# Patient Record
Sex: Male | Born: 1945 | Race: White | Hispanic: No | Marital: Married | Smoking: Former smoker
Health system: Southern US, Community
[De-identification: ages and names within clinical notes are randomized; demographics above are authoritative.]

## PROBLEM LIST (undated history)

## (undated) DIAGNOSIS — F329 Major depressive disorder, single episode, unspecified: Secondary | ICD-10-CM

## (undated) DIAGNOSIS — G35 Multiple sclerosis: Secondary | ICD-10-CM

## (undated) DIAGNOSIS — G2581 Restless legs syndrome: Secondary | ICD-10-CM

## (undated) DIAGNOSIS — F32A Depression, unspecified: Secondary | ICD-10-CM

## (undated) DIAGNOSIS — K219 Gastro-esophageal reflux disease without esophagitis: Secondary | ICD-10-CM

## (undated) DIAGNOSIS — F419 Anxiety disorder, unspecified: Secondary | ICD-10-CM

## (undated) DIAGNOSIS — Z87442 Personal history of urinary calculi: Secondary | ICD-10-CM

## (undated) DIAGNOSIS — J449 Chronic obstructive pulmonary disease, unspecified: Secondary | ICD-10-CM

## (undated) DIAGNOSIS — I10 Essential (primary) hypertension: Secondary | ICD-10-CM

## (undated) DIAGNOSIS — N200 Calculus of kidney: Secondary | ICD-10-CM

## (undated) HISTORY — DX: Calculus of kidney: N20.0

---
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
## 2010-04-05 ENCOUNTER — Ambulatory Visit: Payer: Self-pay | Admitting: Internal Medicine

## 2010-07-21 ENCOUNTER — Ambulatory Visit: Payer: Self-pay | Admitting: Internal Medicine

## 2010-10-11 ENCOUNTER — Ambulatory Visit: Payer: Self-pay | Admitting: Internal Medicine

## 2011-08-22 DIAGNOSIS — K219 Gastro-esophageal reflux disease without esophagitis: Secondary | ICD-10-CM | POA: Insufficient documentation

## 2011-12-28 ENCOUNTER — Ambulatory Visit: Payer: Self-pay | Admitting: Medical

## 2012-03-05 DIAGNOSIS — E041 Nontoxic single thyroid nodule: Secondary | ICD-10-CM | POA: Insufficient documentation

## 2012-06-23 ENCOUNTER — Ambulatory Visit: Payer: Self-pay | Admitting: Family Medicine

## 2014-04-16 DIAGNOSIS — R931 Abnormal findings on diagnostic imaging of heart and coronary circulation: Secondary | ICD-10-CM | POA: Insufficient documentation

## 2014-04-16 DIAGNOSIS — R9439 Abnormal result of other cardiovascular function study: Secondary | ICD-10-CM | POA: Insufficient documentation

## 2014-05-20 DIAGNOSIS — J479 Bronchiectasis, uncomplicated: Secondary | ICD-10-CM | POA: Insufficient documentation

## 2016-01-03 DIAGNOSIS — D649 Anemia, unspecified: Secondary | ICD-10-CM

## 2016-01-03 HISTORY — DX: Anemia, unspecified: D64.9

## 2016-01-12 DIAGNOSIS — N400 Enlarged prostate without lower urinary tract symptoms: Secondary | ICD-10-CM | POA: Insufficient documentation

## 2016-01-12 DIAGNOSIS — N529 Male erectile dysfunction, unspecified: Secondary | ICD-10-CM | POA: Insufficient documentation

## 2017-03-20 DIAGNOSIS — J189 Pneumonia, unspecified organism: Secondary | ICD-10-CM | POA: Insufficient documentation

## 2017-03-21 DIAGNOSIS — J441 Chronic obstructive pulmonary disease with (acute) exacerbation: Secondary | ICD-10-CM | POA: Insufficient documentation

## 2017-05-10 DIAGNOSIS — G2581 Restless legs syndrome: Secondary | ICD-10-CM | POA: Insufficient documentation

## 2017-07-10 DIAGNOSIS — S46012A Strain of muscle(s) and tendon(s) of the rotator cuff of left shoulder, initial encounter: Secondary | ICD-10-CM | POA: Insufficient documentation

## 2017-09-17 DIAGNOSIS — E876 Hypokalemia: Secondary | ICD-10-CM | POA: Insufficient documentation

## 2018-09-02 DIAGNOSIS — R072 Precordial pain: Secondary | ICD-10-CM | POA: Insufficient documentation

## 2018-11-07 ENCOUNTER — Ambulatory Visit
Admission: EM | Admit: 2018-11-07 | Discharge: 2018-11-07 | Disposition: A | Discharge status: Home / Self Care | Payer: Medicare Other | Attending: Family Medicine | Admitting: Family Medicine

## 2018-11-07 ENCOUNTER — Other Ambulatory Visit: Payer: Self-pay

## 2018-11-07 DIAGNOSIS — J3489 Other specified disorders of nose and nasal sinuses: Secondary | ICD-10-CM | POA: Diagnosis not present

## 2018-11-07 MED ORDER — SULFAMETHOXAZOLE-TRIMETHOPRIM 800-160 MG PO TABS: 1.0000 | ORAL_TABLET | Freq: Two times a day (BID) | ORAL | 0 refills | Status: AC

## 2018-11-07 MED ORDER — MUPIROCIN 2 % EX OINT: TOPICAL_OINTMENT | CUTANEOUS | 0 refills | Status: DC

## 2018-11-07 NOTE — Discharge Instructions (Signed)
Medications as prescribed.  Call with concerns.  Take care  Dr. Eulamae Greenstein  

## 2018-11-07 NOTE — ED Triage Notes (Signed)
Patient c/o a sore inside his nose x 3 days. He states the area is painful and swollen.

## 2018-11-07 NOTE — ED Provider Notes (Signed)
[facility]    CSN: [id number] Arrival date & time: 11/07/18  0831  History   Chief Complaint Chief Complaint  Patient presents with  . Facial Pain   HPI  73 year old male presents with complaints of a sore inside of his nose.  Patient reports a 3 to 4-day history of a sore inside of his nose.  Located on the left side.  He states that the area is painful and swollen.  Rates his pain as 4/10 in severity.  Unsure of an inciting event.  No fever.  No known exacerbating factors.  No known relieving factors.  No other associated symptoms.   PMH, Surgical Hx, Family Hx, Social History reviewed and updated as below.  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    Surgical Hx: LENS EYE SURGERY 07/13/11 Left CE/PCIOL TORIC lens ou   ESOPHAGOGASTRODOUDENOSCOPY W/BIOPSY 02/05/2013  Procedure: [id number] W/BIOPSY; Surgeon: Daralene Milch., MD; Location: [facility] ENDO/BRONCH; Service: Gastroenterology;;   CATARACT EXTRACTION 01/02/2009 - 01/01/2010     COLONOSCOPY W/BIOPSY 01/11/2017  Procedure: COLONOSCOPY, FLEXIBLE; WITH BIOPSY, SINGLE OR MULTIPLE; Surgeon: Reynolds Bowl, MD; Location: [facility] ENDO/BRONCH; Service: Gastroenterology;;     Home Medications    Prior to Admission medications   Medication Sig Start Date End Date Taking? Authorizing Provider  albuterol (PROVENTIL) (2.5 MG/3ML) 0.083% nebulizer solution INHALE 1 VIAL VIA NEBULIZER EVERY 6 HOURS AS NEEDED FOR WHEEZING 06/29/18  Yes [provider]  Albuterol Sulfate 2.5 MG/0.5ML NEBU Inhale  into the lungs. 03/21/17  Yes [provider]  ALPRAZolam Prudy Feeler) 0.25 MG tablet Take by mouth. 06/04/09  Yes [provider]  amLODipine (NORVASC) 5 MG tablet Take 5 mg by mouth daily. 05/22/18  Yes [provider]  aspirin 81 MG chewable tablet Chew by mouth. 09/04/18  Yes [provider]  atorvastatin (LIPITOR) 80 MG tablet Take by mouth. 09/04/18  Yes [provider]  buPROPion (WELLBUTRIN SR) 100 MG 12 hr tablet Take by mouth. 06/17/14  Yes [provider]  Cholecalciferol (D2000 ULTRA STRENGTH) 50 MCG (2000 UT) CAPS Take by mouth.   Yes [provider]  esomeprazole (NEXIUM) 40 MG capsule Take by mouth. 01/12/12  Yes [provider]  fluticasone (FLONASE) 50 MCG/ACT nasal spray Place into the nose.   Yes [provider]  Fluticasone-Umeclidin-Vilant 100-62.5-25 MCG/INH AEPB Inhale into the lungs. 09/26/16  Yes [provider]  gabapentin (NEURONTIN) 600 MG tablet Take by mouth. 06/04/09  Yes [provider]  glatiramer (COPAXONE) 20 MG/ML SOSY injection  08/08/06  Yes [provider]  losartan (COZAAR) 100 MG tablet Take by mouth. 11/04/18  Yes [provider]  meclizine (ANTIVERT) 25 MG tablet prn as needed 06/04/09  Yes [provider]  Multiple Vitamin (MULTI-VITAMIN) tablet Take by mouth.   Yes [provider]  potassium chloride SA (KLOR-CON) 20 MEQ tablet Three tabs orally twice daily 04/29/12  Yes [provider]  sertraline (ZOLOFT) 100 MG tablet Take by mouth. 06/04/09  Yes [provider]  spironolactone (ALDACTONE) 25 MG tablet Take by mouth. 08/14/18 08/14/19 Yes  [provider]  zolpidem (AMBIEN CR) 12.5 MG CR tablet    Yes [provider]  mupirocin ointment (BACTROBAN) 2 % Apply to nasal sore twice daily for 7 days. 11/07/18   Coral Spikes, DO  sulfamethoxazole-trimethoprim (BACTRIM DS) 800-160 MG tablet Take 1 tablet by mouth 2  (two) times daily for 7 days. 11/07/18 11/14/18  Coral Spikes, DO    Family History Diabetes type II Brother Arthur Deceased  Obesity Brother Arnell Sieving   Diabetes Father Josafat Enrico   Diabetes type II Father Anthonyjames Bargar Deceased  Obesity Father Courtenay Creger   No Known Problems Maternal Aunt    Asthma Maternal Grandfather Joe Tobey Grim Deceased  Cancer Maternal Grandfather Einar Crow   Heart disease Maternal Grandmother Grant Ruts   Osteoarthritis Maternal Grandmother Grant Ruts   No Known Problems Maternal Uncle    Allergies Mother Earnest Rosier   Deep vein thrombosis (DVT or abnormal blood clot formation) Mother Earnest Rosier Deceased  Hearing loss Mother Earnest Rosier   Heart disease Mother Earnest Rosier   High blood pressure (Hypertension) Mother Earnest Rosier   Neurological disorder Mother Earnest Rosier   Osteoporosis (Thinning of bones) Mother Earnest Rosier Deceased  Other Mother Earnest Rosier ALS  Stroke Mother Earnest Rosier   No Known Problems Paternal Aunt    Heart disease Paternal Grandfather Nicky Kras   Obesity Paternal Grandfather Rc Amison   Diabetes Paternal Grandmother Jeffey Janssen   Diabetes type II Paternal Grandmother Ayomide Purdy Deceased  Skin cancer Paternal Grandmother Aztlan Coll   Stroke Paternal Grandmother Rambo Sarafian   Colon cancer Paternal Uncle Kyandre Okray Deceased  Allergies Sister    Allergic rhinitis Sister Orvis Brill   Deep vein thrombosis (DVT or abnormal blood clot formation) Sister Orvis Brill   Depression Sister Orvis Brill   High blood pressure (Hypertension) Sister Orvis Brill   Skin cancer Sister Orvis Brill   Heart disease Son    Alcohol abuse Son Dwayne Deceased  Anesthesia problems Neg Hx    Clotting disorder Neg Hx    Thyroid disease Neg Hx      Social History Social History   Tobacco Use  . Smoking status: Not on file  Substance Use Topics  . Alcohol use: Not on file  . Drug use: Not  on file     Allergies   Latex, Penicillins, and Doxycycline   Review of Systems Review of Systems  Constitutional: Negative.   HENT:       Sore in nose.   Physical Exam Triage Vital Signs ED Triage Vitals  Enc Vitals Group     BP 11/07/18 0847 (!) 141/68     Pulse Rate 11/07/18 0847 75     Resp 11/07/18 0847 18     Temp 11/07/18 0847 98 F (36.7 C)     Temp Source 11/07/18 0847 Oral     SpO2 11/07/18 0847 98 %     Weight 11/07/18 0844 215 lb (97.5 kg)     Height 11/07/18 0844 6' (1.829 m)     Head Circumference --      Peak Flow --      Pain Score 11/07/18 0843 4     Pain Loc --      Pain Edu? --      Excl. in [place]? --    Updated Vital Signs BP (!) 141/68 (BP Location: Right Arm)   Pulse 75   Temp 98 F (36.7 C) (Oral)  Resp 18   Ht 6' (1.829 m)   Wt 97.5 kg   SpO2 98%   BMI 29.16 kg/m   Visual Acuity Right Eye Distance:   Left Eye Distance:   Bilateral Distance:    Right Eye Near:   Left Eye Near:    Bilateral Near:     Physical Exam Vitals signs and nursing note reviewed.  Constitutional:      General: He is not in acute distress.    Appearance: Normal appearance. He is not ill-appearing.  HENT:     Head: Normocephalic and atraumatic.     Nose:     Comments: Focal area of swelling and erythema located inside the left nostril.  Dried blood and also noted. Eyes:     General:        Right eye: No discharge.        Left eye: No discharge.     Conjunctiva/sclera: Conjunctivae normal.  Cardiovascular:     Rate and Rhythm: Normal rate and regular rhythm.     Heart sounds: No murmur.  Pulmonary:     Effort: Pulmonary effort is normal.     Breath sounds: Normal breath sounds. No wheezing, rhonchi or rales.  Neurological:     Mental Status: He is alert.  Psychiatric:        Mood and Affect: Mood normal.        Behavior: Behavior normal.    UC Treatments / Results  Labs (all labs ordered are listed, but only abnormal results are displayed)  Labs Reviewed - No data to display  EKG   Radiology No results found.  Procedures Procedures (including critical care time)  Medications Ordered in UC Medications - No data to display  Initial Impression / Assessment and Plan / UC Course  I have reviewed the triage vital signs and the nursing notes.  Pertinent labs & imaging results that were available during my care of the patient were reviewed by me and considered in my medical decision making (see chart for details).    73 year old male presents with a sore inside of his nose.  Concern for developing abscess.  Placing on Bactrim and Bactroban ointment.  Final Clinical Impressions(s) / UC Diagnoses   Final diagnoses:  Sore in nose     Discharge Instructions     Medications as prescribed.  Call with concerns.  Take care  Dr. Adriana Simasook     ED Prescriptions    Medication Sig Dispense Auth. Provider   sulfamethoxazole-trimethoprim (BACTRIM DS) 800-160 MG tablet Take 1 tablet by mouth 2 (two) times daily for 7 days. 14 tablet Kaeleen Odom G, DO   mupirocin ointment (BACTROBAN) 2 % Apply to nasal sore twice daily for 7 days. 22 g Tommie Samsook, Kanaan Kagawa G, DO     PDMP not reviewed this encounter.   Tommie SamsCook, Staphanie Harbison G, [place]DO 11/07/18 [phone]

## 2019-03-30 ENCOUNTER — Ambulatory Visit
Admission: EM | Admit: 2019-03-30 | Discharge: 2019-03-30 | Disposition: A | Discharge status: Home / Self Care | Payer: Medicare Other | Attending: Family Medicine | Admitting: Family Medicine

## 2019-03-30 ENCOUNTER — Ambulatory Visit: Payer: Medicare Other

## 2019-03-30 ENCOUNTER — Other Ambulatory Visit: Payer: Self-pay

## 2019-03-30 DIAGNOSIS — R079 Chest pain, unspecified: Secondary | ICD-10-CM | POA: Insufficient documentation

## 2019-03-30 DIAGNOSIS — S2231XA Fracture of one rib, right side, initial encounter for closed fracture: Secondary | ICD-10-CM | POA: Diagnosis not present

## 2019-03-30 DIAGNOSIS — G2581 Restless legs syndrome: Secondary | ICD-10-CM | POA: Insufficient documentation

## 2019-03-30 DIAGNOSIS — Y999 Unspecified external cause status: Secondary | ICD-10-CM | POA: Diagnosis not present

## 2019-03-30 DIAGNOSIS — J449 Chronic obstructive pulmonary disease, unspecified: Secondary | ICD-10-CM | POA: Diagnosis not present

## 2019-03-30 DIAGNOSIS — W19XXXA Unspecified fall, initial encounter: Secondary | ICD-10-CM | POA: Insufficient documentation

## 2019-03-30 DIAGNOSIS — Y929 Unspecified place or not applicable: Secondary | ICD-10-CM | POA: Insufficient documentation

## 2019-03-30 DIAGNOSIS — I10 Essential (primary) hypertension: Secondary | ICD-10-CM | POA: Insufficient documentation

## 2019-03-30 DIAGNOSIS — Y939 Activity, unspecified: Secondary | ICD-10-CM | POA: Diagnosis not present

## 2019-03-30 DIAGNOSIS — G35 Multiple sclerosis: Secondary | ICD-10-CM | POA: Insufficient documentation

## 2019-03-30 DIAGNOSIS — Z87891 Personal history of nicotine dependence: Secondary | ICD-10-CM | POA: Insufficient documentation

## 2019-03-30 HISTORY — DX: Multiple sclerosis: G35

## 2019-03-30 HISTORY — DX: Depression, unspecified: F32.A

## 2019-03-30 HISTORY — DX: Essential (primary) hypertension: I10

## 2019-03-30 HISTORY — DX: Gastro-esophageal reflux disease without esophagitis: K21.9

## 2019-03-30 HISTORY — DX: Restless legs syndrome: G25.81

## 2019-03-30 HISTORY — DX: Anxiety disorder, unspecified: F41.9

## 2019-03-30 HISTORY — DX: Chronic obstructive pulmonary disease, unspecified: J44.9

## 2019-03-30 MED ORDER — HYDROCODONE-ACETAMINOPHEN 5-325 MG PO TABS: 1.0000 | ORAL_TABLET | Freq: Three times a day (TID) | ORAL | 0 refills | Status: DC | PRN

## 2019-03-30 NOTE — Discharge Instructions (Addendum)
It was very nice seeing you today in clinic. Thank you for entrusting me with your care.   Make efforts to remain active. Take deep breaths often to keep from developing pneumonia. May use Tylenol and/or Ibuprofen as needed for pain. Will send in stronger pain medication for severe pain. BE CAREFUL with the pain medication as it can make you sleepy.   Make arrangements to follow up with your regular doctor in 1 week for re-evaluation if not improving. If your symptoms/condition worsens, please seek follow up care either here or in the ER. Please remember, our [facility] providers are "right here with you" when you need [place].   Again, it was my pleasure to take care of you today. Thank you for choosing our clinic. I hope that you start to feel better quickly.   Quentin Mulling, MSN, APRN, FNP-C, CEN Advanced Practice Provider [facility]

## 2019-03-30 NOTE — ED Provider Notes (Signed)
[place]    Name: Jordan [name] DOB: 1945-08-09 MRN: [id number] CSN: [id number] PCP: Jordan Heater, MD  Arrival date and time:  03/30/19 1045  Chief Complaint:  Fall  NOTE: Prior to seeing the patient today, I have reviewed the triage nursing documentation and vital signs. Clinical staff has updated patient's PMH/PSHx, current medication list, and drug allergies/intolerances to ensure comprehensive history available to assist in medical decision making.   History:   HPI: Jordan [name] is a 74 y.o. male who presents today with complaints of pain in his RIGHT lateral chest wall (ribs) following a traumatic fall on Friday (03/28/2019). Patient reports that he was backing down off of a ladder and stepped backwards causing him to fall/trip over a nearby plastic tote. Patient reports that fall caused him to fall into the wall onto his RIGHT shoulder and RIGHT side. Patient did not hit his head when he fell; no LOC. He denies complaints of neck pain. Patient has FROM in the shoulder and notes that it is not painful. He is not experiencing any distal weakness, paraesthesias, or altered sensation. Since his fall, patient reports that he has had pain in his lateral chest wall. He had difficulties sleeping last night due to the pain; patient sleeps on RIGHT side. He has been monitoring his SPO2 at home and reports that it has been normal. Pain increases with deep inspiration, bending, and torso twisting. He reports intermittent feelings of being short of breath; PMH (+) COPD. In efforts to conservatively manage his symptoms at home, the patient notes that he has used APAP, which has not helped to improve his symptoms. Patient presents today in NAD. SPO2 99% on RA. Pain rated 5/10 in clinic.   Past Medical History:  Diagnosis Date  . Anxiety   . COPD (chronic obstructive pulmonary disease) (HCC)   . Depression   . GERD (gastroesophageal reflux disease)   . Hypertension   . MS (multiple sclerosis)  (HCC)   . Restless leg     History reviewed. No pertinent surgical history.  History reviewed. No pertinent family history.  Social History   Tobacco Use  . Smoking status: Former Games developer  . Smokeless tobacco: Never Used  Substance Use Topics  . Alcohol use: Yes    Comment: beer with dinner  . Drug use: Never    There are no problems to display for this patient.   Home Medications:    No outpatient medications have been marked as taking for the 03/30/19 encounter [facility]).    Allergies:   Latex, Penicillins, and Doxycycline  Review of Systems (ROS):  Review of systems NEGATIVE unless otherwise noted in narrative H&P section.   Vital Signs: Today's Vitals   03/30/19 1055 03/30/19 1057 03/30/19 1159  BP: (!) 142/69    Pulse: 74    Resp: 18    Temp: 98.1 F (36.7 C)    TempSrc: Oral    SpO2: 99%    Weight:  215 lb (97.5 kg)   Height:  6' (1.829 m)   PainSc: 5   5     Physical Exam: Physical Exam  Constitutional: He is oriented to person, place, and time and well-developed, well-nourished, and in no distress.  HENT:  Head: Normocephalic and atraumatic.  Eyes: Pupils are equal, round, and reactive to light.  Cardiovascular: Normal rate, regular rhythm, normal heart sounds and intact distal pulses.  Pulmonary/Chest: Effort normal. He has decreased breath sounds in the right upper field and  the right middle field. He has no wheezes. He has no rhonchi. He has no rales. He exhibits tenderness (RIGHT (anterior) just inferior to breast). He exhibits no crepitus, no deformity, no swelling and no retraction.  No SOB or increased WOB. No distress. Able to speak in complete sentences without difficulties. SPO2 99% on RA.   Neurological: He is alert and oriented to person, place, and time. Gait normal.  Skin: Skin is warm and dry. No rash noted. He is not diaphoretic.  Psychiatric: Memory, affect and judgment normal. His mood appears anxious.  Nursing note and  vitals reviewed.   Urgent Care Treatments / Results:   Orders Placed This Encounter  Procedures  . DG Ribs Unilateral W/Chest Right  . Incentive spirometry RT    LABS: PLEASE NOTE: all labs that were ordered this encounter are listed, however only abnormal results are displayed. Labs Reviewed - No data to display  EKG: -None  RADIOLOGY: DG Ribs Unilateral W/Chest Right  Result Date: 03/30/2019 CLINICAL DATA:  Status post fall. Right rib pain. EXAM: RIGHT RIBS AND CHEST - 3+ VIEW COMPARISON:  None. FINDINGS: Nondisplaced fracture of the right anterior sixth rib adjacent to the costochondral junction. There is no evidence of pneumothorax or pleural effusion. Both lungs are clear. Heart size and mediastinal contours are within normal limits. IMPRESSION: Nondisplaced fracture of the right anterior sixth rib adjacent to the costochondral junction. Electronically Signed   By: Kathreen Devoid   On: 03/30/2019 11:33    PROCEDURES: Procedures  MEDICATIONS RECEIVED THIS VISIT: Medications - No data to display  PERTINENT CLINICAL COURSE NOTES/UPDATES:   Initial Impression / Assessment and Plan / Urgent Care Course:  Pertinent labs & imaging results that were available during my care of the patient were personally reviewed by me and considered in my medical decision making (see lab/imaging section of note for values and interpretations).  Jordan [name] is a 74 y.o. male who presents to [facility] today with complaints of Fall  Patient is well appearing overall in clinic today. He does not appear to be in any acute distress. Presenting symptoms (see HPI) and exam as documented above. Fall x 2 days ago. Complaints and exam concerning for rib fracture. Will obtain diagnostic plain films to further assess.   Diagnostic radiographs of the chest with dedicated RIGHT rib series an acute fracture of the RIGHT anterior 6th rib with no evidence of effusion or PTX.  Results reviewed with  patient. Discussed TCDB and pulmonary hygiene exercises in order to prevent the development of a pneumonia. Briefly reviewed the pathophysiology of hypoventilation as it relates to traumatic rib fractures, especially in the elderly. Patient encouraged to remain active as possible. Discussed splinting with a pillow for cough and position changes to help reduce pain. Encouraged ice and/or health application as tolerated. Patient may use APAP and/or IBU as needed for pain. Dicussed that this is a painful injury, and that I am concerned with the pain that he has been having, especially at night seeing whereas he generally sleeps on his RIGHT side. Will provide a short course of Norco 5/325 mg tablets for PRN use. Discussed indications and side effects; patient advised to exercise caution with this medication.   Discussed follow up with primary care physician in 1 week for re-evaluation. I have reviewed the follow up and strict return precautions for any new or worsening symptoms. Patient is aware of symptoms that would be deemed urgent/emergent, and would thus require further evaluation  either here or in the emergency department. At the time of discharge, he verbalized understanding and consent with the discharge plan as it was reviewed with him. All questions were fielded by provider and/or clinic staff prior to patient discharge.    Final Clinical Impressions / Urgent Care Diagnoses:   Final diagnoses:  Closed fracture of one rib of right side, initial encounter  Fall, accidental, initial encounter    New Prescriptions:  [place] Controlled Substance Registry consulted? Yes, I have consulted the [place] Controlled Substances Registry for this patient, and feel the risk/benefit ratio today is favorable for proceeding with this prescription for a controlled substance.  . Discussed use of controlled substance medication to treat his acute pain.  o Reviewed [place] STOP Act regulations  o Clinic does not refill controlled  substances over the phone without face to face evaluation.  . Safety precautions reviewed.  o Medications should not be bitten, chewed, sold, or taken with alcohol.  o Avoid use while working, driving, or operating heavy machinery.  o Side effects associated with the use of this particular medication reviewed. - Patient understands that this medication can cause CNS depression, increase his risk of falls, and even lead to overdose that may result in death, if used outside of the parameters that he and I discussed.  With all of this in mind, he knowingly accepts the risks and responsibilities associated with intended course of treatment, and elects to responsibly proceed as discussed.  Meds ordered this encounter  Medications  . HYDROcodone-acetaminophen (NORCO) 5-325 MG tablet    Sig: Take 1 tablet by mouth every 8 (eight) hours as needed for moderate pain.    Dispense:  12 tablet    Refill:  0    Recommended Follow up Care:  Patient encouraged to follow up with the following provider within the specified time frame, or sooner as dictated by the severity of his symptoms. As always, he was instructed that for any urgent/emergent care needs, he should seek care either here or in the emergency department for more immediate evaluation.  Follow-up Information    Jordan Heater, MD In 1 week.   Specialty: Family Medicine Why: General reassessment of symptoms if not improving Contact information: [address] [place] [address] [phone]         NOTE: This note was prepared using Dragon dictation software along with smaller phrase technology. Despite my best ability to proofread, there is the potential that transcriptional errors may still occur from this process, and are completely unintentional.    Verlee Monte, NP 03/31/19 1951

## 2019-03-30 NOTE — ED Triage Notes (Signed)
Pt reports he tripped backward over a tote in his garage on Friday. Fell against the wall with his right shoulder and pushed his weight into his right side. Having right rib pain and hard to take a deep breath.

## 2019-04-20 ENCOUNTER — Emergency Department
Admission: EM | Admit: 2019-04-20 | Discharge: 2019-04-20 | Disposition: A | Discharge status: Home / Self Care | Payer: Medicare Other | Attending: Emergency Medicine | Admitting: Emergency Medicine

## 2019-04-20 ENCOUNTER — Emergency Department: Payer: Medicare Other

## 2019-04-20 ENCOUNTER — Other Ambulatory Visit: Payer: Self-pay

## 2019-04-20 DIAGNOSIS — G35 Multiple sclerosis: Secondary | ICD-10-CM | POA: Diagnosis not present

## 2019-04-20 DIAGNOSIS — Z91040 Latex allergy status: Secondary | ICD-10-CM | POA: Diagnosis not present

## 2019-04-20 DIAGNOSIS — Z7982 Long term (current) use of aspirin: Secondary | ICD-10-CM | POA: Diagnosis not present

## 2019-04-20 DIAGNOSIS — J449 Chronic obstructive pulmonary disease, unspecified: Secondary | ICD-10-CM | POA: Diagnosis not present

## 2019-04-20 DIAGNOSIS — Z87891 Personal history of nicotine dependence: Secondary | ICD-10-CM | POA: Diagnosis not present

## 2019-04-20 DIAGNOSIS — R1032 Left lower quadrant pain: Secondary | ICD-10-CM | POA: Diagnosis present

## 2019-04-20 DIAGNOSIS — I10 Essential (primary) hypertension: Secondary | ICD-10-CM | POA: Diagnosis not present

## 2019-04-20 DIAGNOSIS — N200 Calculus of kidney: Secondary | ICD-10-CM | POA: Diagnosis not present

## 2019-04-20 DIAGNOSIS — Z79899 Other long term (current) drug therapy: Secondary | ICD-10-CM | POA: Diagnosis not present

## 2019-04-20 LAB — URINALYSIS, ROUTINE W REFLEX MICROSCOPIC
Bacteria, UA: NONE SEEN
Bilirubin Urine: NEGATIVE
Glucose, UA: NEGATIVE mg/dL
Ketones, ur: NEGATIVE mg/dL
Nitrite: NEGATIVE
Protein, ur: NEGATIVE mg/dL
Specific Gravity, Urine: 1.01 (ref 1.005–1.030)
pH: 7 (ref 5.0–8.0)

## 2019-04-20 LAB — CBC WITH DIFFERENTIAL/PLATELET
Abs Immature Granulocytes: 0.05 10*3/uL (ref 0.00–0.07)
Basophils Absolute: 0.1 10*3/uL (ref 0.0–0.1)
Basophils Relative: 1 %
Eosinophils Absolute: 0.4 10*3/uL (ref 0.0–0.5)
Eosinophils Relative: 3 %
HCT: 46 % (ref 39.0–52.0)
Hemoglobin: 15.7 g/dL (ref 13.0–17.0)
Immature Granulocytes: 0 %
Lymphocytes Relative: 10 %
Lymphs Abs: 1.2 10*3/uL (ref 0.7–4.0)
MCH: 31.2 pg (ref 26.0–34.0)
MCHC: 34.1 g/dL (ref 30.0–36.0)
MCV: 91.5 fL (ref 80.0–100.0)
Monocytes Absolute: 0.7 10*3/uL (ref 0.1–1.0)
Monocytes Relative: 6 %
Neutro Abs: 9.6 10*3/uL — ABNORMAL HIGH (ref 1.7–7.7)
Neutrophils Relative %: 80 %
Platelets: 192 10*3/uL (ref 150–400)
RBC: 5.03 MIL/uL (ref 4.22–5.81)
RDW: 13.3 % (ref 11.5–15.5)
WBC: 11.9 10*3/uL — ABNORMAL HIGH (ref 4.0–10.5)
nRBC: 0 % (ref 0.0–0.2)

## 2019-04-20 LAB — BASIC METABOLIC PANEL
Anion gap: 7 (ref 5–15)
BUN: 17 mg/dL (ref 8–23)
CO2: 28 mmol/L (ref 22–32)
Calcium: 9.2 mg/dL (ref 8.9–10.3)
Chloride: 102 mmol/L (ref 98–111)
Creatinine, Ser: 0.99 mg/dL (ref 0.61–1.24)
GFR calc Af Amer: >60 mL/min (ref >60)
GFR calc non Af Amer: >60 mL/min (ref >60)
Glucose, Bld: 127 mg/dL — ABNORMAL HIGH (ref 70–99)
Potassium: 4.6 mmol/L (ref 3.5–5.1)
Sodium: 137 mmol/L (ref 135–145)

## 2019-04-20 MED ORDER — ONDANSETRON HCL 4 MG PO TABS: 4.0000 mg | ORAL_TABLET | Freq: Three times a day (TID) | ORAL | 0 refills | Status: DC | PRN

## 2019-04-20 MED ORDER — HYDROCODONE-ACETAMINOPHEN 5-325 MG PO TABS: 1.0000 | ORAL_TABLET | Freq: Four times a day (QID) | ORAL | 0 refills | Status: AC | PRN

## 2019-04-20 MED ORDER — TAMSULOSIN HCL 0.4 MG PO CAPS: 0.4000 mg | ORAL_CAPSULE | Freq: Every day | ORAL | 0 refills | Status: DC

## 2019-04-20 MED ORDER — FENTANYL CITRATE (PF) 100 MCG/2ML IJ SOLN
50.0000 ug | Freq: Once | INTRAMUSCULAR | Status: AC
  Administered 2019-04-20: 50 ug via INTRAVENOUS
  Filled 2019-04-20: qty 2

## 2019-04-20 NOTE — ED Provider Notes (Signed)
[facility] Emergency Department Provider Note   ____________________________________________   I have reviewed the triage vital signs and the nursing notes.   HISTORY  Chief Complaint Groin Pain   History limited by: Not Limited   HPI Jordan [name] is a 74 y.o. male who presents to the emergency department today with concerns for left groin and left flank pain.  The patient states the pain started off in his groin early this morning.  He has not noticed any recent change or problems with urination.  Patient states that the pain has since started radiated up towards his left flank.  He does have a history of kidney stones and typically does get the pain in his left flank.  The patient states he is never required any lithotripsy for his previous kidney stones.  Last any stone was a number of years ago.  He denies any fevers.  Records reviewed. Per medical record review patient has a history of COPD, GERD, HTN.   Past Medical History:  Diagnosis Date  . Anxiety   . COPD (chronic obstructive pulmonary disease) ([place])   . Depression   . GERD (gastroesophageal reflux disease)   . Hypertension   . MS (multiple sclerosis) ([place])   . Restless leg     There are no problems to display for this patient.   No past surgical history on file.  Prior to Admission medications   Medication Sig Start Date End Date Taking? Authorizing Provider  albuterol (PROVENTIL) (2.5 MG/3ML) 0.083% nebulizer solution INHALE 1 VIAL VIA NEBULIZER EVERY 6 HOURS AS NEEDED FOR WHEEZING 06/29/18   [provider]  Albuterol Sulfate 2.5 MG/0.5ML NEBU Inhale into the lungs. 03/21/17   [provider]  ALPRAZolam Duanne Moron) 0.25 MG tablet Take by mouth. 06/04/09   [provider]  amLODipine (NORVASC) 5 MG tablet Take 5 mg by mouth daily. 05/22/18   [provider]  aspirin 81 MG chewable tablet Chew by mouth. 09/04/18   [provider]  atorvastatin  (LIPITOR) 80 MG tablet Take by mouth. 09/04/18   [provider]  buPROPion (WELLBUTRIN SR) 100 MG 12 hr tablet Take by mouth. 06/17/14   [provider]  Cholecalciferol (D2000 ULTRA STRENGTH) 50 MCG (2000 UT) CAPS Take by mouth.    [provider]  esomeprazole (NEXIUM) 40 MG capsule Take by mouth. 01/12/12   [provider]  fluticasone (FLONASE) 50 MCG/ACT nasal spray Place into the nose.    [provider]  Fluticasone-Umeclidin-Vilant 100-62.5-25 MCG/INH AEPB Inhale into the lungs. 09/26/16   [provider]  gabapentin (NEURONTIN) 600 MG tablet Take by mouth. 06/04/09   [provider]  glatiramer (COPAXONE) 20 MG/ML SOSY injection  08/08/06   [provider]  HYDROcodone-acetaminophen (NORCO) 5-325 MG tablet Take 1 tablet by mouth every 8 (eight) hours as needed for moderate pain. 03/30/19   Karen Kitchens, NP  losartan (COZAAR) 100 MG tablet Take by mouth. 11/04/18   [provider]  meclizine (ANTIVERT) 25 MG tablet prn as needed 06/04/09   [provider]  Multiple Vitamin (MULTI-VITAMIN) tablet Take by mouth.    [provider]  mupirocin ointment (BACTROBAN) 2 % Apply to nasal sore twice daily for 7 days. 11/07/18   Coral Spikes, DO  potassium chloride SA (KLOR-CON) 20 MEQ tablet Three tabs orally twice daily 04/29/12   [provider]  sertraline (ZOLOFT) 100 MG tablet Take by mouth. 06/04/09   [provider]  spironolactone (ALDACTONE) 25 MG tablet Take by mouth. 08/14/18 08/14/19  [provider]  zolpidem (AMBIEN CR) 12.5 MG CR tablet     [provider]    Allergies Latex, Penicillins, and Doxycycline  No family history on file.  Social History Social History   Tobacco Use  . Smoking status: Former Games developer  . Smokeless tobacco: Never Used  Substance Use Topics  . Alcohol use: Yes    Comment: beer with dinner  . Drug use: Never    Review of  Systems Constitutional: No fever/chills Eyes: No visual changes. ENT: No sore throat. Cardiovascular: Denies chest pain. Respiratory: Denies shortness of breath. Gastrointestinal: Positive for left flank pain. Genitourinary: Negative for dysuria. Positive for left groin pain.  Musculoskeletal: Negative for back pain. Skin: Negative for rash. Neurological: Negative for headaches, focal weakness or numbness.  ____________________________________________   PHYSICAL EXAM:  VITAL SIGNS: ED Triage Vitals  Enc Vitals Group     BP 04/20/19 0622 (!) 148/73     Pulse Rate 04/20/19 0622 75     Resp 04/20/19 0622 16     Temp 04/20/19 0622 98.4 F (36.9 C)     Temp Source 04/20/19 0622 Oral     SpO2 04/20/19 0622 97 %     Weight 04/20/19 0623 215 lb (97.5 kg)     Height 04/20/19 0623 6' (1.829 m)     Head Circumference --      Peak Flow --      Pain Score 04/20/19 0625 8   Constitutional: Alert and oriented.  Eyes: Conjunctivae are normal.  ENT      Head: Normocephalic and atraumatic.      Nose: No congestion/rhinnorhea.      Mouth/Throat: Mucous membranes are moist.      Neck: No stridor. Hematological/Lymphatic/Immunilogical: No cervical lymphadenopathy. Cardiovascular: Normal rate, regular rhythm.  No murmurs, rubs, or gallops.  Respiratory: Normal respiratory effort without tachypnea nor retractions. Breath sounds are clear and equal bilaterally. No wheezes/rales/rhonchi. Gastrointestinal: Soft and non tender. No rebound. No guarding.  Genitourinary: Deferred Musculoskeletal: Normal range of motion in all extremities. No lower extremity edema. Neurologic:  Normal speech and language. No gross focal neurologic deficits are appreciated.  Skin:  Skin is warm, dry and intact. No rash noted. Psychiatric: Mood and affect are normal. Speech and behavior are normal. Patient exhibits appropriate insight and judgment.  ____________________________________________    LABS (pertinent  positives/negatives)  BMP wnl except glu 127 CBC wbc 11.9, hgb 15.7, plt 192 UA hazy, small hgb dipstick, trace leukocytes, 21-50 rbc, 6-10 wbc ____________________________________________   EKG  None  ____________________________________________    RADIOLOGY  CT renal 2 calculi in left proximal ureter, largest measuring 1 cm.   ____________________________________________   PROCEDURES  Procedures  ____________________________________________   INITIAL IMPRESSION / ASSESSMENT AND PLAN / ED COURSE  Pertinent labs & imaging results that were available during my care of the patient were reviewed by me and considered in my medical decision making (see chart for details).   Patient presented to the emergency department today because of concerns for left groin and left flank pain.  Patient has history of kidney stones.  CT renal shows 2 proximal left ureteral stones.  Largest 1 cm in diameter.  Patient was given pain medication here which helped ease his pain.  At this time I doubt infection given lack of fever, very minimal leukocytosis in the serum and no bacteria seen in the UA.  Discussed with  Dr. Alycia Rossetti with urology will help arrange follow-up in clinic.  Discussed with patient importance of follow up with urology.  ____________________________________________   FINAL CLINICAL IMPRESSION(S) / ED DIAGNOSES  Final diagnoses:  Kidney stone     Note: This dictation was prepared with Dragon dictation. Any transcriptional errors that result from this process are unintentional     Phineas Semen, MD 04/20/19 1041

## 2019-04-20 NOTE — ED Notes (Signed)
Pt c/o flank pain and pain similar to previous kidney stones. Discussed with dr Derrill Kay, will order ct scan.

## 2019-04-20 NOTE — Discharge Instructions (Addendum)
Please seek medical attention for any high fevers, chest pain, shortness of breath, change in behavior, persistent vomiting, bloody stool or any other new or concerning symptoms.  

## 2019-04-20 NOTE — ED Notes (Signed)
Dr. Derrill Kay at bedside for for updated plan of care.  Pt verbalized understanding.  No acute changes or s/s of disterss noted at this time.  Wife at bedside.  Will continue to monitor/reassess.

## 2019-04-20 NOTE — ED Triage Notes (Signed)
Patient to lobby in wheelchair by EMS from home.  Patient complains of left groin pain that woke him from sleep.  Patient reports history of kidney stones in the past.

## 2019-04-21 ENCOUNTER — Other Ambulatory Visit: Payer: Self-pay | Admitting: *Deleted

## 2019-04-21 DIAGNOSIS — N200 Calculus of kidney: Secondary | ICD-10-CM

## 2019-04-21 NOTE — Progress Notes (Signed)
kub

## 2019-04-22 ENCOUNTER — Other Ambulatory Visit: Payer: Self-pay

## 2019-04-22 ENCOUNTER — Ambulatory Visit
Admission: RE | Admit: 2019-04-22 | Discharge: 2019-04-22 | Disposition: A | Discharge status: Home / Self Care | Payer: Medicare Other | Attending: Urology | Admitting: Urology

## 2019-04-22 ENCOUNTER — Encounter: Payer: Self-pay | Admitting: Urology

## 2019-04-22 ENCOUNTER — Ambulatory Visit (INDEPENDENT_AMBULATORY_CARE_PROVIDER_SITE_OTHER): Payer: Medicare Other | Admitting: Urology

## 2019-04-22 ENCOUNTER — Ambulatory Visit
Admission: RE | Admit: 2019-04-22 | Discharge: 2019-04-22 | Disposition: A | Discharge status: Home / Self Care | Payer: Medicare Other | Source: Ambulatory Visit | Attending: Urology | Admitting: Urology

## 2019-04-22 VITALS — BP 132/76 | HR 81 | Ht 72.0 in | Wt 217.0 lb

## 2019-04-22 DIAGNOSIS — N200 Calculus of kidney: Secondary | ICD-10-CM | POA: Diagnosis present

## 2019-04-22 DIAGNOSIS — N201 Calculus of ureter: Secondary | ICD-10-CM | POA: Diagnosis not present

## 2019-04-22 DIAGNOSIS — N132 Hydronephrosis with renal and ureteral calculous obstruction: Secondary | ICD-10-CM | POA: Diagnosis not present

## 2019-04-22 DIAGNOSIS — N23 Unspecified renal colic: Secondary | ICD-10-CM

## 2019-04-23 ENCOUNTER — Telehealth: Payer: Self-pay | Admitting: *Deleted

## 2019-04-23 ENCOUNTER — Encounter: Payer: Self-pay | Admitting: Urology

## 2019-04-23 NOTE — Telephone Encounter (Signed)
Will need orders please.

## 2019-04-23 NOTE — H&P (View-Only) (Signed)
 04/22/2019 7:44 AM   Jordan [name] 04/22/1945 [id number]  Referring provider: Adams, Randy Steven, MD [address] [place] [address]  Chief Complaint  Patient presents with  . Nephrolithiasis    HPI: Jordan [name] is a 73 y.o. male who presents for evaluation of a left ureteral calculus.  -Presented to [facility] ED 04/20/2019 with a several hour history of left groin pain radiating to the left hemiscrotum -No identifiable precipitating, aggravating or alleviating factors -Also some radiation to the left flank -Denied fever, chills, nausea, vomiting -Prior history stones not requiring intervention -Stone protocol CT showed a >10 mm left lower proximal ureteral calculus with mild to moderate hydronephrosis/hydroureter -Minimal pain since ED visit; has taken 1 hydrocodone   PMH: Past Medical History:  Diagnosis Date  . Anxiety   . COPD (chronic obstructive pulmonary disease) ([facility])   . Depression   . GERD (gastroesophageal reflux disease)   . Hypertension   . MS (multiple sclerosis) ([facility])   . Restless leg     Surgical History: No past surgical history on file.  Home Medications:  Allergies as of 04/22/2019      Reactions   Latex Anaphylaxis   Penicillins Hives, Rash   Doxycycline Other (See Comments)   Abdominal cramping      Medication List       Accurate as of April 22, 2019 11:59 PM. If you have any questions, ask your nurse or doctor.        Albuterol Sulfate 2.5 MG/0.5ML Nebu Inhale into the lungs.   albuterol (2.5 MG/3ML) 0.083% nebulizer solution Commonly known as: PROVENTIL INHALE 1 VIAL VIA NEBULIZER EVERY 6 HOURS AS NEEDED FOR WHEEZING   ALPRAZolam 0.25 MG tablet Commonly known as: XANAX Take by mouth.   amLODipine 5 MG tablet Commonly known as: NORVASC Take 5 mg by mouth daily.   aspirin 81 MG chewable tablet Chew by mouth.   atorvastatin 80 MG tablet Commonly known as: LIPITOR Take by mouth.   buPROPion 100 MG 12 hr  tablet Commonly known as: WELLBUTRIN SR Take by mouth.   D2000 Ultra Strength 50 MCG (2000 UT) Caps Generic drug: Cholecalciferol Take by mouth.   esomeprazole 40 MG capsule Commonly known as: NEXIUM Take by mouth.   fluticasone 50 MCG/ACT nasal spray Commonly known as: FLONASE Place into the nose.   Fluticasone-Umeclidin-Vilant 100-62.5-25 MCG/INH Aepb Inhale into the lungs.   gabapentin 600 MG tablet Commonly known as: NEURONTIN Take by mouth.   glatiramer 20 MG/ML Sosy injection Commonly known as: COPAXONE   HYDROcodone-acetaminophen 5-325 MG tablet Commonly known as: Norco Take 1 tablet by mouth every 8 (eight) hours as needed for moderate pain.   HYDROcodone-acetaminophen 5-325 MG tablet Commonly known as: NORCO/VICODIN Take 1 tablet by mouth every 6 (six) hours as needed for severe pain.   losartan 100 MG tablet Commonly known as: COZAAR Take by mouth.   meclizine 25 MG tablet Commonly known as: ANTIVERT prn as needed   Multi-Vitamin tablet Take by mouth.   mupirocin ointment 2 % Commonly known as: Bactroban Apply to nasal sore twice daily for 7 days.   ondansetron 4 MG tablet Commonly known as: Zofran Take 1 tablet (4 mg total) by mouth every 8 (eight) hours as needed.   potassium chloride SA 20 MEQ tablet Commonly known as: KLOR-CON Three tabs orally twice daily   sertraline 100 MG tablet Commonly known as: ZOLOFT Take by mouth.   spironolactone 25 MG tablet   Commonly known as: ALDACTONE Take by mouth.   tamsulosin 0.4 MG Caps capsule Commonly known as: Flomax Take 1 capsule (0.4 mg total) by mouth daily.   zolpidem 12.5 MG CR tablet Commonly known as: AMBIEN CR       Allergies:  Allergies  Allergen Reactions  . Latex Anaphylaxis  . Penicillins Hives and Rash  . Doxycycline Other (See Comments)    Abdominal cramping     Family History: No family history on file.  Social History:  reports that he has quit smoking. He has  never used smokeless tobacco. He reports current alcohol use. He reports that he does not use drugs.   Physical Exam: BP 132/76   Pulse 81   Ht 6' (1.829 m)   Wt 217 lb (98.4 kg)   BMI 29.43 kg/m   Constitutional:  Alert and oriented, No acute distress. HEENT: [place] AT, moist mucus membranes.  Trachea midline, no masses. Cardiovascular: No clubbing, cyanosis, or edema.  RRR Respiratory: Normal respiratory effort, no increased work of breathing.  Clear GI: Abdomen is soft, nontender, nondistended, no abdominal masses GU: No CVA tenderness Skin: No rashes, bruises or suspicious lesions. Neurologic: Grossly intact, no focal deficits, moving all 4 extremities. Psychiatric: Normal mood and affect.    Pertinent Imaging: CT personally reviewed.  This appears to be one crescent shaped calculus and not to calculi. Results for orders placed during the hospital encounter of 04/20/19  CT Renal Stone Study   Narrative CLINICAL DATA:  Acute left groin pain.  EXAM: CT ABDOMEN AND PELVIS WITHOUT CONTRAST  TECHNIQUE: Multidetector CT imaging of the abdomen and pelvis was performed following the standard protocol without IV contrast.  COMPARISON:  None.  FINDINGS: Lower chest: No acute abnormality.  Hepatobiliary: No focal liver abnormality is seen. No gallstones, gallbladder wall thickening, or biliary dilatation.  Pancreas: Unremarkable. No pancreatic ductal dilatation or surrounding inflammatory changes.  Spleen: Calcified splenic granulomata are noted.  Adrenals/Urinary Tract: Adrenal glands appear normal. Nonobstructive right nephrolithiasis is noted. Exophytic right renal cyst is noted. Multiple left renal cysts are noted. Mild left hydronephrosis is noted secondary to 2 adjacent calculi in the proximal right ureter, the largest measuring 1 cm. Perinephric stranding is noted on the left. Urinary bladder is unremarkable.  Stomach/Bowel: Stomach is within normal limits. Appendix  appears normal. No evidence of bowel wall thickening, distention, or inflammatory changes.  Vascular/Lymphatic: Aortic atherosclerosis. No enlarged abdominal or pelvic lymph nodes.  Reproductive: Mild prostatic enlargement is noted.  Other: No abdominal wall hernia or abnormality. No abdominopelvic ascites.  Musculoskeletal: No acute or significant osseous findings.  IMPRESSION: 1. Nonobstructive right nephrolithiasis. 2. Mild left hydronephrosis with perinephric stranding is noted secondary to 2 adjacent calculi in the proximal right ureter, the largest measuring 1 cm. 3. Mild prostatic enlargement.  Aortic Atherosclerosis (ICD10-I70.0).   Electronically Signed   By: Lupita Raider M.D.   On: 04/20/2019 08:51     Assessment & Plan:    - Left ureteral calculus Minimally symptomatic though based on stone size unlikely that it will pass spontaneously.  We discussed various treatment options for urolithiasis including observation with or without medical expulsive therapy, shockwave lithotripsy (SWL), ureteroscopy and laser lithotripsy with stent placement.  We discussed that management is based on stone size, location, density, patient co-morbidities, and patient preference.   Stones <67mm in size have a >80% spontaneous passage rate. Data surrounding the use of tamsulosin for medical expulsive therapy is controversial, but meta analyses suggests  it is most efficacious for distal stones between 5-61mm in size. Possible side effects include dizziness/lightheadedness, and retrograde ejaculation.  SWL has a lower stone free rate in a single procedure, but also a lower complication rate compared to ureteroscopy and avoids a stent and associated stent related symptoms. Possible complications include renal hematoma, steinstrasse, and need for additional treatment.  Ureteroscopy with laser lithotripsy and stent placement has a higher stone free rate than SWL in a single procedure,  however increased complication rate including possible infection, ureteral injury, bleeding, and stent related morbidity. Common stent related symptoms include dysuria, urgency/frequency, and flank pain.  The calculus is not well visualized on CT topogram and a KUB was ordered.  He would like to think over these options.  He is scheduled to head to the beach this coming Sunday and is contemplating delaying treatment briefly since he is minimally symptomatic   Abbie Sons, MD  [place] [address], [place], [place] [address] [phone]

## 2019-04-23 NOTE — Telephone Encounter (Signed)
Patient notified and is agreeable to go ahead with URS. He was notified that we would be in contact to schedule surgery

## 2019-04-23 NOTE — Progress Notes (Signed)
04/22/2019 7:44 AM   Jordan [name] 03-May-1945 [id number]  Referring provider: Garry Heater, MD [address] [place],  [place] [address]  Chief Complaint  Patient presents with  . Nephrolithiasis    HPI: Jordan [name] is a 74 y.o. male who presents for evaluation of a left ureteral calculus.  -Presented to [facility] ED 04/20/2019 with a several hour history of left groin pain radiating to the left hemiscrotum -No identifiable precipitating, aggravating or alleviating factors -Also some radiation to the left flank -Denied fever, chills, nausea, vomiting -Prior history stones not requiring intervention -Stone protocol CT showed a >10 mm left lower proximal ureteral calculus with mild to moderate hydronephrosis/hydroureter -Minimal pain since ED visit; has taken 1 hydrocodone   PMH: Past Medical History:  Diagnosis Date  . Anxiety   . COPD (chronic obstructive pulmonary disease) (HCC)   . Depression   . GERD (gastroesophageal reflux disease)   . Hypertension   . MS (multiple sclerosis) (HCC)   . Restless leg     Surgical History: No past surgical history on file.  Home Medications:  Allergies as of 04/22/2019      Reactions   Latex Anaphylaxis   Penicillins Hives, Rash   Doxycycline Other (See Comments)   Abdominal cramping      Medication List       Accurate as of April 22, 2019 11:59 PM. If you have any questions, ask your nurse or doctor.        Albuterol Sulfate 2.5 MG/0.5ML Nebu Inhale into the lungs.   albuterol (2.5 MG/3ML) 0.083% nebulizer solution Commonly known as: PROVENTIL INHALE 1 VIAL VIA NEBULIZER EVERY 6 HOURS AS NEEDED FOR WHEEZING   ALPRAZolam 0.25 MG tablet Commonly known as: XANAX Take by mouth.   amLODipine 5 MG tablet Commonly known as: NORVASC Take 5 mg by mouth daily.   aspirin 81 MG chewable tablet Chew by mouth.   atorvastatin 80 MG tablet Commonly known as: LIPITOR Take by mouth.   buPROPion 100 MG 12 hr  tablet Commonly known as: WELLBUTRIN SR Take by mouth.   D2000 Ultra Strength 50 MCG (2000 UT) Caps Generic drug: Cholecalciferol Take by mouth.   esomeprazole 40 MG capsule Commonly known as: NEXIUM Take by mouth.   fluticasone 50 MCG/ACT nasal spray Commonly known as: FLONASE Place into the nose.   Fluticasone-Umeclidin-Vilant 100-62.5-25 MCG/INH Aepb Inhale into the lungs.   gabapentin 600 MG tablet Commonly known as: NEURONTIN Take by mouth.   glatiramer 20 MG/ML Sosy injection Commonly known as: COPAXONE   HYDROcodone-acetaminophen 5-325 MG tablet Commonly known as: Norco Take 1 tablet by mouth every 8 (eight) hours as needed for moderate pain.   HYDROcodone-acetaminophen 5-325 MG tablet Commonly known as: NORCO/VICODIN Take 1 tablet by mouth every 6 (six) hours as needed for severe pain.   losartan 100 MG tablet Commonly known as: COZAAR Take by mouth.   meclizine 25 MG tablet Commonly known as: ANTIVERT prn as needed   Multi-Vitamin tablet Take by mouth.   mupirocin ointment 2 % Commonly known as: Bactroban Apply to nasal sore twice daily for 7 days.   ondansetron 4 MG tablet Commonly known as: Zofran Take 1 tablet (4 mg total) by mouth every 8 (eight) hours as needed.   potassium chloride SA 20 MEQ tablet Commonly known as: KLOR-CON Three tabs orally twice daily   sertraline 100 MG tablet Commonly known as: ZOLOFT Take by mouth.   spironolactone 25 MG tablet  Commonly known as: ALDACTONE Take by mouth.   tamsulosin 0.4 MG Caps capsule Commonly known as: Flomax Take 1 capsule (0.4 mg total) by mouth daily.   zolpidem 12.5 MG CR tablet Commonly known as: AMBIEN CR       Allergies:  Allergies  Allergen Reactions  . Latex Anaphylaxis  . Penicillins Hives and Rash  . Doxycycline Other (See Comments)    Abdominal cramping     Family History: No family history on file.  Social History:  reports that he has quit smoking. He has  never used smokeless tobacco. He reports current alcohol use. He reports that he does not use drugs.   Physical Exam: BP 132/76   Pulse 81   Ht 6' (1.829 m)   Wt 217 lb (98.4 kg)   BMI 29.43 kg/m   Constitutional:  Alert and oriented, No acute distress. HEENT: [place] AT, moist mucus membranes.  Trachea midline, no masses. Cardiovascular: No clubbing, cyanosis, or edema.  RRR Respiratory: Normal respiratory effort, no increased work of breathing.  Clear GI: Abdomen is soft, nontender, nondistended, no abdominal masses GU: No CVA tenderness Skin: No rashes, bruises or suspicious lesions. Neurologic: Grossly intact, no focal deficits, moving all 4 extremities. Psychiatric: Normal mood and affect.    Pertinent Imaging: CT personally reviewed.  This appears to be one crescent shaped calculus and not to calculi. Results for orders placed during the hospital encounter of 04/20/19  CT Renal Stone Study   Narrative CLINICAL DATA:  Acute left groin pain.  EXAM: CT ABDOMEN AND PELVIS WITHOUT CONTRAST  TECHNIQUE: Multidetector CT imaging of the abdomen and pelvis was performed following the standard protocol without IV contrast.  COMPARISON:  None.  FINDINGS: Lower chest: No acute abnormality.  Hepatobiliary: No focal liver abnormality is seen. No gallstones, gallbladder wall thickening, or biliary dilatation.  Pancreas: Unremarkable. No pancreatic ductal dilatation or surrounding inflammatory changes.  Spleen: Calcified splenic granulomata are noted.  Adrenals/Urinary Tract: Adrenal glands appear normal. Nonobstructive right nephrolithiasis is noted. Exophytic right renal cyst is noted. Multiple left renal cysts are noted. Mild left hydronephrosis is noted secondary to 2 adjacent calculi in the proximal right ureter, the largest measuring 1 cm. Perinephric stranding is noted on the left. Urinary bladder is unremarkable.  Stomach/Bowel: Stomach is within normal limits. Appendix  appears normal. No evidence of bowel wall thickening, distention, or inflammatory changes.  Vascular/Lymphatic: Aortic atherosclerosis. No enlarged abdominal or pelvic lymph nodes.  Reproductive: Mild prostatic enlargement is noted.  Other: No abdominal wall hernia or abnormality. No abdominopelvic ascites.  Musculoskeletal: No acute or significant osseous findings.  IMPRESSION: 1. Nonobstructive right nephrolithiasis. 2. Mild left hydronephrosis with perinephric stranding is noted secondary to 2 adjacent calculi in the proximal right ureter, the largest measuring 1 cm. 3. Mild prostatic enlargement.  Aortic Atherosclerosis (ICD10-I70.0).   Electronically Signed   By: Lupita Raider M.D.   On: 04/20/2019 08:51     Assessment & Plan:    - Left ureteral calculus Minimally symptomatic though based on stone size unlikely that it will pass spontaneously.  We discussed various treatment options for urolithiasis including observation with or without medical expulsive therapy, shockwave lithotripsy (SWL), ureteroscopy and laser lithotripsy with stent placement.  We discussed that management is based on stone size, location, density, patient co-morbidities, and patient preference.   Stones <67mm in size have a >80% spontaneous passage rate. Data surrounding the use of tamsulosin for medical expulsive therapy is controversial, but meta analyses suggests  it is most efficacious for distal stones between 5-61mm in size. Possible side effects include dizziness/lightheadedness, and retrograde ejaculation.  SWL has a lower stone free rate in a single procedure, but also a lower complication rate compared to ureteroscopy and avoids a stent and associated stent related symptoms. Possible complications include renal hematoma, steinstrasse, and need for additional treatment.  Ureteroscopy with laser lithotripsy and stent placement has a higher stone free rate than SWL in a single procedure,  however increased complication rate including possible infection, ureteral injury, bleeding, and stent related morbidity. Common stent related symptoms include dysuria, urgency/frequency, and flank pain.  The calculus is not well visualized on CT topogram and a KUB was ordered.  He would like to think over these options.  He is scheduled to head to the beach this coming Sunday and is contemplating delaying treatment briefly since he is minimally symptomatic   Abbie Sons, MD  [place] [address], [place], [place] [address] [phone]

## 2019-04-23 NOTE — Telephone Encounter (Signed)
-----   Message from Riki Altes, MD sent at 04/22/2019  3:51 PM EDT ----- It looks like his stone has progressed approximately 4 inches however based on location I think shockwave lithotripsy would be difficult and ureteroscopy would be the best option for treatment.

## 2019-04-24 ENCOUNTER — Other Ambulatory Visit: Payer: Self-pay

## 2019-04-24 ENCOUNTER — Other Ambulatory Visit: Payer: Self-pay | Admitting: Radiology

## 2019-04-24 DIAGNOSIS — N201 Calculus of ureter: Secondary | ICD-10-CM

## 2019-04-25 ENCOUNTER — Other Ambulatory Visit: Payer: Self-pay

## 2019-04-25 ENCOUNTER — Ambulatory Visit (INDEPENDENT_AMBULATORY_CARE_PROVIDER_SITE_OTHER): Payer: Medicare Other | Admitting: *Deleted

## 2019-04-25 DIAGNOSIS — N201 Calculus of ureter: Secondary | ICD-10-CM | POA: Diagnosis not present

## 2019-04-25 NOTE — Progress Notes (Signed)
In and Out Catheterization  Patient is present today for a I & O catheterization due to UA. Patient was cleaned and prepped in a sterile fashion with betadine . A 14FR cath was inserted no complications were noted , 73ml of urine return was noted, urine was yellow in color. A clean urine sample was collected for a UA/Urine culture. Bladder was drained  And catheter was removed with out difficulty.    Performed by: Milas Kocher, CMA  Follow up/ Additional notes: as scheduled

## 2019-04-28 LAB — CULTURE, URINE COMPREHENSIVE

## 2019-04-30 ENCOUNTER — Other Ambulatory Visit: Payer: Self-pay

## 2019-04-30 ENCOUNTER — Encounter
Admission: RE | Admit: 2019-04-30 | Discharge: 2019-04-30 | Disposition: A | Discharge status: Home / Self Care | Payer: Medicare Other | Source: Ambulatory Visit | Attending: Urology | Admitting: Urology

## 2019-04-30 HISTORY — DX: Personal history of urinary calculi: Z87.442

## 2019-04-30 NOTE — Patient Instructions (Signed)
Your procedure is scheduled on: Tuesday May 06, 2019 Report to Day Surgery inside [facility]. To find out your arrival time please call [phone] between 1PM - 3PM on Monday May 05, 2019.  Remember: Instructions that are not followed completely may result in serious medical risk,  up to and including death, or upon the discretion of your surgeon and anesthesiologist your  surgery may need to be rescheduled.     _X__ 1. Do not eat food after midnight the night before your procedure.                 No gum chewing or hard candies. You may drink clear liquids up to 2 hours                 before you are scheduled to arrive for your surgery- DO not drink clear                 liquids within 2 hours of the start of your surgery.                 Clear Liquids include:  water, apple juice without pulp, clear Gatorade, G2 or                  Gatorade Zero (avoid Red/Purple/Blue), Black Coffee or Tea (Do not add                 anything to coffee or tea).  __X__2.  On the morning of surgery brush your teeth with toothpaste and water, you                may rinse your mouth with mouthwash if you wish.  Do not swallow any toothpaste of mouthwash.     _X__ 3.  No Alcohol for 24 hours before or after surgery.   _X__ 4.  Do Not Smoke or use e-cigarettes For 24 Hours Prior to Your Surgery.                 Do not use any chewable tobacco products for at least 6 hours prior to                 Surgery.  _X__  5.  Do not use any recreational drugs (marijuana, cocaine, heroin, ecstacy, MDMA or other)                For at least one week prior to your surgery.  Combination of these drugs with anesthesia                May have life threatening results.  __x__ 6.  Notify your doctor if there is any change in your medical condition      (cold, fever, infections).     Do not wear jewelry, make-up, hairpins, clips or nail polish. Do not wear lotions, powders, or perfumes. You may  wear deodorant. Do not shave 48 hours prior to surgery. Men may shave face and neck. Do not bring valuables to the hospital.    [facility] is not responsible for any belongings or valuables.  Contacts, dentures or bridgework may not be worn into surgery. Leave your suitcase in the car. After surgery it may be brought to your room. For patients admitted to the hospital, discharge time is determined by your treatment team.   Patients discharged the day of surgery will not be allowed to drive home.   Make arrangements for someone to be with you  for the first 24 hours of your Same Day Discharge.    __X__ Take these medicines the morning of surgery with A SIP OF WATER:    1. amLODipine (NORVASC) 5 MG   2. buPROPion (WELLBUTRIN XL) 150 MG 24  3. esomeprazole (NEXIUM) 40 MG  4. meclizine (ANTIVERT) 25 MG  __X__ Use inhalers on the day of surgery  albuterol (PROVENTIL) (2.5   Fluticasone-Umeclidin-Vilant 100-62.5-25 MCG/INH  fluticasone (FLONASE) 50 nasal spray   __X_ Stop aspirin 81 MG  as instructed by provider  __X__ Stop Anti-inflammatories such as Ibuprofen, Aleve, naproxen, and or BC powders.   __X__ Stop supplements until after surgery.    __X__  Do not add any herbal supplements before your surgery.

## 2019-05-02 ENCOUNTER — Other Ambulatory Visit: Payer: Self-pay

## 2019-05-02 ENCOUNTER — Encounter
Admission: RE | Admit: 2019-05-02 | Discharge: 2019-05-02 | Disposition: A | Discharge status: Home / Self Care | Payer: Medicare Other | Source: Ambulatory Visit | Attending: Urology | Admitting: Urology

## 2019-05-02 DIAGNOSIS — Z20822 Contact with and (suspected) exposure to covid-19: Secondary | ICD-10-CM | POA: Diagnosis not present

## 2019-05-02 DIAGNOSIS — Z01818 Encounter for other preprocedural examination: Secondary | ICD-10-CM | POA: Insufficient documentation

## 2019-05-02 DIAGNOSIS — J449 Chronic obstructive pulmonary disease, unspecified: Secondary | ICD-10-CM | POA: Diagnosis not present

## 2019-05-02 DIAGNOSIS — I10 Essential (primary) hypertension: Secondary | ICD-10-CM | POA: Insufficient documentation

## 2019-05-02 DIAGNOSIS — R9431 Abnormal electrocardiogram [ECG] [EKG]: Secondary | ICD-10-CM | POA: Insufficient documentation

## 2019-05-02 LAB — SARS CORONAVIRUS 2 (TAT 6-24 HRS): SARS Coronavirus 2: NEGATIVE

## 2019-05-02 NOTE — Pre-Procedure Instructions (Signed)
ECG 12 Lead9/01/2018 [facility] Component Name Value Ref Range  EKG Systolic BP  mmHg  EKG Diastolic BP  mmHg  EKG Ventricular Rate 86 BPM  EKG Atrial Rate 86 BPM  EKG P-R Interval 208 ms  EKG QRS Duration 94 ms  EKG Q-T Interval 362 ms  EKG QTC Calculation 433 ms  EKG Calculated P Axis 30 degrees  EKG Calculated R Axis 51 degrees  EKG Calculated T Axis 41 degrees  QTC Fredericia 408 ms  Result Narrative  SINUS RHYTHM WITH PREMATURE SUPRAVENTRICULAR BEATS SEPTAL INFARCT (CITED ON OR BEFORE 02-Sep-2018) ABNORMAL ECG WHEN COMPARED WITH ECG OF 03-Sep-2018 03:33, SINUS RHYTHM HAS REPLACED ATRIAL FIBRILLATION SERIAL CHANGES OF SEPTAL INFARCT PRESENT Confirmed by Joneen Roach ([id number]) on 09/03/2018 8:42:23 AM  Other Result Information  Interface, Rad Results In - 09/03/2018  8:42 AM EDT SINUS RHYTHM WITH PREMATURE SUPRAVENTRICULAR BEATS SEPTAL INFARCT  (CITED ON OR BEFORE 02-Sep-2018) ABNORMAL ECG WHEN COMPARED WITH ECG OF 03-Sep-2018 03:33, SINUS RHYTHM HAS REPLACED ATRIAL FIBRILLATION SERIAL CHANGES OF SEPTAL INFARCT  PRESENT Confirmed by Joneen Roach ([id number]) on 09/03/2018 8:42:23 AM  Status Results Details   Encounter Summary

## 2019-05-05 MED ORDER — CIPROFLOXACIN IN D5W 400 MG/200ML IV SOLN
400.0000 mg | INTRAVENOUS | Status: AC
  Administered 2019-05-06: 400 mg via INTRAVENOUS

## 2019-05-06 ENCOUNTER — Encounter: Payer: Self-pay | Admitting: Urology

## 2019-05-06 ENCOUNTER — Ambulatory Visit: Payer: Medicare Other | Admitting: Anesthesiology

## 2019-05-06 ENCOUNTER — Ambulatory Visit
Admission: RE | Admit: 2019-05-06 | Discharge: 2019-05-06 | Disposition: A | Discharge status: Home / Self Care | Payer: Medicare Other | Attending: Urology | Admitting: Urology

## 2019-05-06 ENCOUNTER — Ambulatory Visit: Payer: Medicare Other

## 2019-05-06 ENCOUNTER — Telehealth: Payer: Self-pay | Admitting: Radiology

## 2019-05-06 ENCOUNTER — Other Ambulatory Visit: Payer: Self-pay

## 2019-05-06 ENCOUNTER — Encounter
Admission: RE | Disposition: A | Discharge status: Home / Self Care | Payer: Self-pay | Source: Home / Self Care | Attending: Urology

## 2019-05-06 DIAGNOSIS — Z79899 Other long term (current) drug therapy: Secondary | ICD-10-CM | POA: Diagnosis not present

## 2019-05-06 DIAGNOSIS — K219 Gastro-esophageal reflux disease without esophagitis: Secondary | ICD-10-CM | POA: Diagnosis not present

## 2019-05-06 DIAGNOSIS — I10 Essential (primary) hypertension: Secondary | ICD-10-CM | POA: Diagnosis not present

## 2019-05-06 DIAGNOSIS — F419 Anxiety disorder, unspecified: Secondary | ICD-10-CM | POA: Insufficient documentation

## 2019-05-06 DIAGNOSIS — Z87891 Personal history of nicotine dependence: Secondary | ICD-10-CM | POA: Diagnosis not present

## 2019-05-06 DIAGNOSIS — G35 Multiple sclerosis: Secondary | ICD-10-CM | POA: Insufficient documentation

## 2019-05-06 DIAGNOSIS — N201 Calculus of ureter: Secondary | ICD-10-CM

## 2019-05-06 DIAGNOSIS — J449 Chronic obstructive pulmonary disease, unspecified: Secondary | ICD-10-CM | POA: Diagnosis not present

## 2019-05-06 HISTORY — PX: CYSTOSCOPY W/ RETROGRADES: SHX1426

## 2019-05-06 HISTORY — PX: CYSTOSCOPY/URETEROSCOPY/HOLMIUM LASER/STENT PLACEMENT: SHX6546

## 2019-05-06 SURGERY — CYSTOSCOPY/URETEROSCOPY/HOLMIUM LASER/STENT PLACEMENT
Anesthesia: General | Site: Ureter | Laterality: Left

## 2019-05-06 MED ORDER — LACTATED RINGERS IV SOLN: INTRAVENOUS | Status: DC

## 2019-05-06 MED ORDER — DEXAMETHASONE SODIUM PHOSPHATE 10 MG/ML IJ SOLN
INTRAMUSCULAR | Status: DC | PRN
  Administered 2019-05-06: 10 mg via INTRAVENOUS

## 2019-05-06 MED ORDER — PROPOFOL 10 MG/ML IV BOLUS
INTRAVENOUS | Status: AC
  Filled 2019-05-06: qty 20

## 2019-05-06 MED ORDER — IOHEXOL 180 MG/ML  SOLN
INTRAMUSCULAR | Status: DC | PRN
  Administered 2019-05-06: 10:00:00 20 mL

## 2019-05-06 MED ORDER — ONDANSETRON HCL 4 MG/2ML IJ SOLN
INTRAMUSCULAR | Status: AC
  Filled 2019-05-06: qty 2

## 2019-05-06 MED ORDER — LIDOCAINE HCL (PF) 2 % IJ SOLN
INTRAMUSCULAR | Status: AC
  Filled 2019-05-06: qty 5

## 2019-05-06 MED ORDER — FENTANYL CITRATE (PF) 100 MCG/2ML IJ SOLN
INTRAMUSCULAR | Status: AC
  Filled 2019-05-06: qty 2

## 2019-05-06 MED ORDER — LIDOCAINE HCL (CARDIAC) PF 100 MG/5ML IV SOSY
PREFILLED_SYRINGE | INTRAVENOUS | Status: DC | PRN
  Administered 2019-05-06: 60 mg via INTRAVENOUS

## 2019-05-06 MED ORDER — ONDANSETRON HCL 4 MG/2ML IJ SOLN: 4.0000 mg | Freq: Once | INTRAMUSCULAR | Status: DC | PRN

## 2019-05-06 MED ORDER — ONDANSETRON HCL 4 MG/2ML IJ SOLN
INTRAMUSCULAR | Status: DC | PRN
  Administered 2019-05-06: 4 mg via INTRAVENOUS

## 2019-05-06 MED ORDER — PROPOFOL 10 MG/ML IV BOLUS
INTRAVENOUS | Status: DC | PRN
  Administered 2019-05-06: 130 mg via INTRAVENOUS
  Administered 2019-05-06: 70 mg via INTRAVENOUS

## 2019-05-06 MED ORDER — CIPROFLOXACIN IN D5W 400 MG/200ML IV SOLN
INTRAVENOUS | Status: AC
  Filled 2019-05-06: qty 200

## 2019-05-06 MED ORDER — FENTANYL CITRATE (PF) 100 MCG/2ML IJ SOLN
25.0000 ug | INTRAMUSCULAR | Status: DC | PRN
  Administered 2019-05-06: 25 ug via INTRAVENOUS

## 2019-05-06 MED ORDER — OXYBUTYNIN CHLORIDE 5 MG PO TABS
ORAL_TABLET | ORAL | 0 refills | Status: DC
Start: 2019-05-06 — End: 2023-04-10

## 2019-05-06 MED ORDER — PHENYLEPHRINE HCL (PRESSORS) 10 MG/ML IV SOLN
INTRAVENOUS | Status: DC | PRN
  Administered 2019-05-06 (×2): 100 ug via INTRAVENOUS

## 2019-05-06 MED ORDER — DEXAMETHASONE SODIUM PHOSPHATE 10 MG/ML IJ SOLN
INTRAMUSCULAR | Status: AC
  Filled 2019-05-06: qty 1

## 2019-05-06 MED ORDER — FENTANYL CITRATE (PF) 100 MCG/2ML IJ SOLN
INTRAMUSCULAR | Status: DC | PRN
  Administered 2019-05-06 (×2): 25 ug via INTRAVENOUS

## 2019-05-06 SURGICAL SUPPLY — 28 items
BAG DRAIN CYSTO-URO LG1000N (MISCELLANEOUS) ×3 IMPLANT
BASKET ZERO TIP 1.9FR (BASKET) ×3 IMPLANT
BRUSH SCRUB EZ 1% IODOPHOR (MISCELLANEOUS) ×3 IMPLANT
CATH URETL 5X70 OPEN END (CATHETERS) IMPLANT
CNTNR SPEC 2.5X3XGRAD LEK (MISCELLANEOUS)
CONT SPEC 4OZ STER OR WHT (MISCELLANEOUS)
CONTAINER SPEC 2.5X3XGRAD LEK (MISCELLANEOUS) IMPLANT
DRAPE UTILITY 15X26 TOWEL STRL (DRAPES) ×3 IMPLANT
FIBER LASER TRACTIP 200 (UROLOGICAL SUPPLIES) ×3 IMPLANT
GLOVE BIO SURGEON STRL SZ8 (GLOVE) ×3 IMPLANT
GOWN STRL REUS W/ TWL LRG LVL3 (GOWN DISPOSABLE) ×1 IMPLANT
GOWN STRL REUS W/ TWL XL LVL3 (GOWN DISPOSABLE) ×1 IMPLANT
GOWN STRL REUS W/TWL LRG LVL3 (GOWN DISPOSABLE) ×2
GOWN STRL REUS W/TWL XL LVL3 (GOWN DISPOSABLE) ×2
GUIDEWIRE STR DUAL SENSOR (WIRE) ×3 IMPLANT
INFUSOR MANOMETER BAG 3000ML (MISCELLANEOUS) ×3 IMPLANT
INTRODUCER DILATOR DOUBLE (INTRODUCER) IMPLANT
KIT TURNOVER CYSTO (KITS) ×3 IMPLANT
PACK CYSTO AR (MISCELLANEOUS) ×3 IMPLANT
SET CYSTO W/LG BORE CLAMP LF (SET/KITS/TRAYS/PACK) ×3 IMPLANT
SHEATH URETERAL 12FRX35CM (MISCELLANEOUS) IMPLANT
SOL .9 NS 3000ML IRR  AL (IV SOLUTION) ×2
SOL .9 NS 3000ML IRR UROMATIC (IV SOLUTION) ×1 IMPLANT
STENT URET 6FRX24 CONTOUR (STENTS) IMPLANT
STENT URET 6FRX26 CONTOUR (STENTS) ×3 IMPLANT
SURGILUBE 2OZ TUBE FLIPTOP (MISCELLANEOUS) ×3 IMPLANT
VALVE UROSEAL ADJ ENDO (VALVE) IMPLANT
WATER STERILE IRR 1000ML POUR (IV SOLUTION) ×3 IMPLANT

## 2019-05-06 NOTE — Telephone Encounter (Signed)
Patient reports 6-7/10 pain post op despite taking vicodin every 6 hours. Advised patient to alternate with ibuprofen and take oxybutynin as prescribed. Per Dr Lonna Cobb, advised patient to go to emergency room for IV pain meds if pain remains severe. Questions answered. Patient verbalized understanding.

## 2019-05-06 NOTE — Interval H&P Note (Signed)
History and Physical Interval Note: 74 y.o. male presents for left ureteroscopic stone removal. Lungs: Clear CV: RRR  05/06/2019 8:46 AM  Jordan [name]  has presented today for surgery, with the diagnosis of left ureteral calculus.  The various methods of treatment have been discussed with the patient and family. After consideration of risks, benefits and other options for treatment, the patient has consented to  Procedure(s): CYSTOSCOPY/URETEROSCOPY/HOLMIUM LASER/STENT PLACEMENT (Left) as a surgical intervention.  The patient's history has been reviewed, patient examined, no change in status, stable for surgery.  I have reviewed the patient's chart and labs.  Questions were answered to the patient's satisfaction.     Jordan [name]

## 2019-05-06 NOTE — Op Note (Signed)
Preoperative diagnosis: Left mid ureteral calculus  Postoperative diagnosis: Left mid ureteral calculus  Procedure:  1. Cystoscopy 2. Left ureteroscopy and stone removal 3. Ureteroscopic laser lithotripsy 4. Left ureteral stent placement (6FR) 26 cm 5. Left retrograde pyelography with interpretation  Surgeon: Lorin Picket C. Rashaud Ybarbo, M.D.  Anesthesia: General  Complications: None  Intraoperative findings:  1.  Left retrograde pyelography post procedure showed no filling defects, stone fragments or contrast extravasation  EBL: Minimal  Specimens: 1. Calculus fragments for analysis   Indication: Jordan [name] is a 74 y.o. year old patient with a 7 x 14 mm crescent-shaped left mid ureteral calculus who has been minimally symptomatic over the last 14 days however based on stone size he presents for ureteroscopic removal. After reviewing the management options for treatment, the patient elected to proceed with the above surgical procedure(s). We have discussed the potential benefits and risks of the procedure, side effects of the proposed treatment, the likelihood of the patient achieving the goals of the procedure, and any potential problems that might occur during the procedure or recuperation. Informed consent has been obtained.  Description of procedure:  The patient was taken to the operating room and general anesthesia was induced.  The patient was placed in the dorsal lithotomy position, prepped and draped in the usual sterile fashion, and preoperative antibiotics were administered. A preoperative time-out was performed.   A 21 French cystoscope was lubricated and passed under direct vision.  The urethra was normal in caliber without stricture.  The prostate demonstrated mild lateral lobe enlargement and mild elevation of the bladder neck.  Panendoscopy was performed and the bladder mucosa showed no erythema, solid or papillary lesions.  Attention was directed to the left ureteral  orifice and a 0.038 Sensor wire was then advanced up the ureter into the renal pelvis under fluoroscopic guidance.  A 4.5 Fr semirigid ureteroscope was then advanced into the ureter next to the guidewire and the calculus was identified in the mid ureter.   The stone was then fragmented with a 200 micron holmium laser fiber on an initial setting of 0.8 J and frequency of 8 hz which was subsequently increased to 1.0/10.   All fragments were then removed from the ureter with a zero tip nitinol basket.  Reinspection of the ureter revealed no remaining visible stones or fragments.   Retrograde pyelogram was performed with findings as described above.  A 6 FR/26 CM stent was was placed under fluoroscopic guidance.  The wire was then removed with an adequate stent curl noted in the renal pelvis as well as in the bladder.  The bladder was then emptied and the procedure ended.  The patient appeared to tolerate the procedure well and without complications.  After anesthetic reversal the patient was transported to the PACU in stable condition.   Plan: Scheduled for office follow-up in 7-10 days for stent removal.   Irineo Axon, MD

## 2019-05-06 NOTE — Transfer of Care (Signed)
Immediate Anesthesia Transfer of Care Note  Patient: Jordan [name]  Procedure(s) Performed: CYSTOSCOPY/URETEROSCOPY/HOLMIUM LASER/STENT PLACEMENT (Left Ureter) CYSTOSCOPY WITH RETROGRADE PYELOGRAM (Left Ureter)  Patient Location: PACU  Anesthesia Type:General  Level of Consciousness: awake and alert   Airway & Oxygen Therapy: Patient Spontanous Breathing and Patient connected to face mask oxygen  Post-op Assessment: Report given to RN and Post -op Vital signs reviewed and stable  Post vital signs: Reviewed and stable  Last Vitals:  Vitals Value Taken Time  BP 139/62 05/06/19 1036  Temp    Pulse 63 05/06/19 1038  Resp 12 05/06/19 1038  SpO2 98 % 05/06/19 1038  Vitals shown include unvalidated device data.  Last Pain:  Vitals:   05/06/19 0759  TempSrc: Temporal  PainSc: 0-No pain         Complications: No apparent anesthesia complications

## 2019-05-06 NOTE — Discharge Instructions (Signed)
DISCHARGE INSTRUCTIONS FOR KIDNEY STONE/URETERAL STENT   MEDICATIONS:  1. Resume all your other meds from home.  2.  AZO (over-the-counter) can help with the burning/stinging when you urinate. 3.  You may continue hydrocodone for moderate pain. 4.  Rx oxybutynin was sent to pharmacy for stent irritation, you can also continue tamsulosin  ACTIVITY:  1. May resume regular activities in 24 hours. 2. No driving while on narcotic pain medications  3. Drink plenty of water  4. Continue to walk at home - you can still get blood clots when you are at home, so keep active, but don't over do it.  5. May return to work/school tomorrow or when you feel ready   BATHING:  1. You can shower.  SIGNS/SYMPTOMS TO CALL:  Please call us if you have a fever greater than 101.5, uncontrolled nausea/vomiting, uncontrolled pain, dizziness, unable to urinate, excessively bloody urine, chest pain, shortness of breath, leg swelling, leg pain, or any other concerns or questions.   Common postoperative symptoms include urinary frequency, urgency, bladder spasm.  Blood in the urine is common.  You can reach [place] at [phone].   FOLLOW-UP:  1. You have a follow-up appointment scheduled 05/16/2019 for stent removal   AMBULATORY SURGERY  DISCHARGE INSTRUCTIONS   1) The drugs that you were given will stay in your system until tomorrow so for the next 24 hours you should not:  A) Drive an automobile B) Make any legal decisions C) Drink any alcoholic beverage   2) You may resume regular meals tomorrow.  Today it is better to start with liquids and gradually work up to solid foods.  You may eat anything you prefer, but it is better to start with liquids, then soup and crackers, and gradually work up to solid foods.   3) Please notify your doctor immediately if you have any unusual bleeding, trouble breathing, redness and pain at the surgery site, drainage, fever, or pain not relieved by  medication.    4) Additional Instructions:        Please contact your physician with any problems or Same Day Surgery at [phone], Monday through Friday 6 am to 4 pm, or [place] at [facility] number at [phone].

## 2019-05-06 NOTE — Anesthesia Procedure Notes (Signed)
Procedure Name: LMA Insertion Date/Time: 05/06/2019 9:08 AM Performed by: Katherine Basset, CRNA Pre-anesthesia Checklist: Patient identified, Emergency Drugs available, Suction available, Timeout performed and Patient being monitored Patient Re-evaluated:Patient Re-evaluated prior to induction Oxygen Delivery Method: Circle system utilized Preoxygenation: Pre-oxygenation with 100% oxygen Induction Type: IV induction LMA Size: 5.0 Number of attempts: 1 Placement Confirmation: positive ETCO2 and breath sounds checked- equal and bilateral Tube secured with: Tape Dental Injury: Teeth and Oropharynx as per pre-operative assessment

## 2019-05-06 NOTE — Anesthesia Preprocedure Evaluation (Signed)
Anesthesia Evaluation  Patient identified by MRN, date of birth, ID band Patient awake    Reviewed: Allergy & Precautions, H&P , NPO status , Patient's Chart, lab work & pertinent test results, reviewed documented beta blocker date and time   Airway Mallampati: II  TM Distance: >3 FB Neck ROM: full    Dental  (+) Teeth Intact   Pulmonary COPD, former smoker,    Pulmonary exam normal        Cardiovascular Exercise Tolerance: Good hypertension, On Medications negative cardio ROS Normal cardiovascular exam Rate:Normal     Neuro/Psych PSYCHIATRIC DISORDERS Anxiety Depression negative neurological ROS     GI/Hepatic Neg liver ROS, GERD  Medicated,  Endo/Other  negative endocrine ROS  Renal/GU negative Renal ROS  negative genitourinary   Musculoskeletal   Abdominal   Peds  Hematology  (+) Blood dyscrasia, anemia ,   Anesthesia Other Findings   Reproductive/Obstetrics negative OB ROS                             Anesthesia Physical Anesthesia Plan  ASA: III  Anesthesia Plan: General LMA   Post-op Pain Management:    Induction:   PONV Risk Score and Plan:   Airway Management Planned:   Additional Equipment:   Intra-op Plan:   Post-operative Plan:   Informed Consent: I have reviewed the patients History and Physical, chart, labs and discussed the procedure including the risks, benefits and alternatives for the proposed anesthesia with the patient or authorized representative who has indicated his/her understanding and acceptance.       Plan Discussed with: CRNA  Anesthesia Plan Comments:         Anesthesia Quick Evaluation

## 2019-05-12 NOTE — Anesthesia Postprocedure Evaluation (Signed)
Anesthesia Post Note  Patient: Jordan [name]  Procedure(s) Performed: CYSTOSCOPY/URETEROSCOPY/HOLMIUM LASER/STENT PLACEMENT (Left Ureter) CYSTOSCOPY WITH RETROGRADE PYELOGRAM (Left Ureter)  Patient location during evaluation: PACU Anesthesia Type: General Level of consciousness: awake and alert Pain management: pain level controlled Vital Signs Assessment: post-procedure vital signs reviewed and stable Respiratory status: spontaneous breathing, nonlabored ventilation, respiratory function stable and patient connected to nasal cannula oxygen Cardiovascular status: blood pressure returned to baseline and stable Postop Assessment: no apparent nausea or vomiting Anesthetic complications: no     Last Vitals:  Vitals:   05/06/19 1121 05/06/19 1129  BP: 134/68 (!) 134/59  Pulse: 64 65  Resp: 10 18  Temp: 36.5 C 36.5 C  SpO2: 93% 93%    Last Pain:  Vitals:   05/07/19 0847  TempSrc:   PainSc: 0-No pain                 Yevette Edwards

## 2019-05-13 LAB — CALCULI, WITH PHOTOGRAPH (CLINICAL LAB)
Calcium Oxalate Dihydrate: 20 %
Calcium Oxalate Monohydrate: 80 %
Weight Calculi: 28 mg

## 2019-05-16 ENCOUNTER — Ambulatory Visit (INDEPENDENT_AMBULATORY_CARE_PROVIDER_SITE_OTHER): Payer: Medicare Other | Admitting: Urology

## 2019-05-16 ENCOUNTER — Telehealth: Payer: Self-pay | Admitting: Family Medicine

## 2019-05-16 ENCOUNTER — Other Ambulatory Visit: Payer: Self-pay

## 2019-05-16 ENCOUNTER — Encounter: Payer: Self-pay | Admitting: Urology

## 2019-05-16 VITALS — BP 148/74 | HR 73 | Ht 72.0 in | Wt 217.0 lb

## 2019-05-16 DIAGNOSIS — N132 Hydronephrosis with renal and ureteral calculous obstruction: Secondary | ICD-10-CM

## 2019-05-16 DIAGNOSIS — Z466 Encounter for fitting and adjustment of urinary device: Secondary | ICD-10-CM

## 2019-05-16 MED ORDER — CIPROFLOXACIN HCL 500 MG PO TABS
500.0000 mg | ORAL_TABLET | Freq: Once | ORAL | Status: AC
  Administered 2019-05-16: 500 mg via ORAL

## 2019-05-16 MED ORDER — LIDOCAINE HCL URETHRAL/MUCOSAL 2 % EX GEL
1.0000 | Freq: Once | CUTANEOUS | Status: AC
Start: 2019-05-16 — End: 2019-05-16
  Administered 2019-05-16: 1 via URETHRAL

## 2019-05-16 NOTE — Patient Instructions (Signed)

## 2019-05-16 NOTE — Telephone Encounter (Signed)
Patient called and states after he had the stent removed in office today he has had continuous bleeding from the penis. I spoke to [facility] and he states that he noticed there was some irritation in the urethra suggests the patient put pressure on the penis for about 10 minutes with a cloth or just hold pressure on the penis. Stoioff says if the the bleeding does not stop he may need to go to the ED. Patient informed of Dr. Heywood Footman suggestions, patient voiced understanding.

## 2019-05-16 NOTE — Progress Notes (Signed)
Indications: Patient is 74 y.o., male status post ureteroscopic removal of a 14 mm left mid ureteral calculus 05/06/2019.  He had no postoperative problems.  The patient is presenting today for stent removal.  Procedure:  Flexible Cystoscopy with stent removal (67124)  Timeout was performed and the correct patient, procedure and participants were identified.    Description:  The patient was prepped and draped in the usual sterile fashion. Flexible cystosopy was performed.  The stent was visualized, grasped, and removed intact without difficulty. The patient tolerated the procedure well.  A single dose of oral antibiotics was given.  Complications:  None  Plan:  -Stone analysis was 80% calcium oxalate monohydrate/20% calcium oxalate dihydrate.  He is first-time stone former.  We discussed general stone prevention guidelines. It was recommended he  increase his water intake to keep urine output greater than 2 L per day.  Ten 10 ounce glasses of water per day is generally enough to produce this output.  Oxalate moderation was discussed and she was provided literature on high oxalate foods and beverages.  Avoidance of salty foods and added salt was discussed as well as avoidance of excessive intake of animal protein.  Increased intake of potassium rich citrus products was recommended.  -Follow-up 6 months with KUB   Irineo Axon, MD

## 2019-05-17 LAB — MICROSCOPIC EXAMINATION
Bacteria, UA: NONE SEEN
RBC, Urine: >30 /hpf — AB (ref 0–2)

## 2019-05-17 LAB — URINALYSIS, COMPLETE
Bilirubin, UA: NEGATIVE
Glucose, UA: NEGATIVE
Ketones, UA: NEGATIVE
Nitrite, UA: NEGATIVE
Specific Gravity, UA: 1.02 (ref 1.005–1.030)
Urobilinogen, Ur: 1 mg/dL (ref 0.2–1.0)
pH, UA: 6.5 (ref 5.0–7.5)

## 2019-06-17 ENCOUNTER — Other Ambulatory Visit: Payer: Self-pay

## 2019-06-17 ENCOUNTER — Ambulatory Visit
Admission: EM | Admit: 2019-06-17 | Discharge: 2019-06-17 | Disposition: A | Discharge status: Home / Self Care | Payer: Medicare Other

## 2019-06-17 NOTE — ED Notes (Signed)
Provider went into room to see patient and appears the patient has eloped.

## 2019-06-17 NOTE — ED Triage Notes (Signed)
Pt states he is having increased issues with asthma over last two days.  Has used nebulizer and rescue inhaler at home with some relief.  Productive cough, light yellow mucous.  States this happens every year and if he lets it go too long it turns into bronchitis.  Usually helped by steroid meds and/or abx.  Gets SOB if walks long distances.  Denies CP, fever.

## 2019-11-17 ENCOUNTER — Ambulatory Visit: Payer: Self-pay | Admitting: Urology

## 2020-10-05 ENCOUNTER — Other Ambulatory Visit: Payer: Self-pay

## 2020-10-05 ENCOUNTER — Ambulatory Visit
Admission: EM | Admit: 2020-10-05 | Discharge: 2020-10-05 | Disposition: A | Discharge status: Home / Self Care | Payer: Medicare Other

## 2020-10-05 DIAGNOSIS — S0121XA Laceration without foreign body of nose, initial encounter: Secondary | ICD-10-CM | POA: Diagnosis not present

## 2020-10-05 DIAGNOSIS — S0083XA Contusion of other part of head, initial encounter: Secondary | ICD-10-CM | POA: Diagnosis not present

## 2020-10-05 DIAGNOSIS — S161XXA Strain of muscle, fascia and tendon at neck level, initial encounter: Secondary | ICD-10-CM

## 2020-10-05 NOTE — Discharge Instructions (Addendum)
Recommend keep steri-strips on nose for next 5 days. Continue to clean other areas of face with soap and water. May take Tylenol 1000mg  every 6 to 8 hours as needed for pain. Continue to monitor pain and symptoms. If any redness, increased swelling, or discharge occurs from cut on your nose, return for recheck. Otherwise, follow-up as needed.

## 2020-10-05 NOTE — ED Triage Notes (Addendum)
Pt c/o fall this morning, reports he lost his balance when leaning forward and hit his face on the ground, mostly mud. Pt denies LOC or other injury. Pt does have some bruising and a small cut where his glasses were sitting on his face. Pt denies any pain, swelling or epistaxis. Pt also reports pain/soreness to his neck.

## 2020-10-05 NOTE — ED Provider Notes (Signed)
[facility]    CSN: [id number] Arrival date & time: 10/05/20  1313      History   Chief Complaint Chief Complaint  Patient presents with   Fall   Facial Injury    HPI Jordan [name] is a 75 y.o. male.   75 year old male presents with injury to his face that occurred this morning. He was outside on a downward incline emptying his flower pots when he leaned over too far and fell forward into the mud/ground. He was wearing his glasses and they broke cutting the bridge of his nose and bruising the sides. He denies any loss of consciousness or previous dizziness. No vision changes and face/head is not sore or painful. Has noticed back of neck is sore now. No nausea or vomiting. Has been taking Tylenol with some relief. Wife helped to wash face with soap and water. Patient believes he is up to date on Tetanus. Multiple chronic health issues including HTN, hyperlipidemia, GERD, COPD, BPH, anemia, multiple sclerosis, restless leg, asthma, and mood disorder. Also history of GI bleed so can not take NSAIDs. Currently on more than 20 medications including Norvasc, Losartan, Aldactone, Lipitor, Potassium supplement, Wellbutrin, Sinemet IR, Mysoline, Neurontin, Topamax, Zoloft, Nexium, Ditropan, Flomax, Flovent, and Flonase daily,  Ambien, Albuterol, Meclizine, Zofran, and Xanax prn.   The history is provided by the patient.   Past Medical History:  Diagnosis Date   Anemia 2018   Anxiety    COPD (chronic obstructive pulmonary disease) (HCC)    Depression    GERD (gastroesophageal reflux disease)    History of kidney stones    Hypertension    Kidney stone    MS (multiple sclerosis) (HCC)    Restless leg     Patient Active Problem List   Diagnosis Date Noted   Substernal chest pain 09/02/2018   Hypokalemia 09/17/2017   Traumatic complete tear of left rotator cuff 07/10/2017   Restless leg 05/10/2017   Acute exacerbation of chronic obstructive pulmonary disease (COPD) (HCC)  03/21/2017   CAP (community acquired pneumonia) 03/20/2017   Benign prostatic hyperplasia without lower urinary tract symptoms 01/12/2016   Erectile dysfunction 01/12/2016   Bronchiectasis without complication (HCC) 05/20/2014   Abnormal echocardiogram 04/16/2014   Abnormal nuclear stress test 04/16/2014   Anxiety 10/02/2012   History of adenomatous polyp of colon 10/02/2012   Chronic laryngitis 03/05/2012   Liver lesion 03/05/2012   Thyroid nodule 03/05/2012   Encounter for long-term (current) use of other medications 01/12/2012   History of migraine 01/12/2012   History of nephrolithiasis 01/12/2012   Vitamin D deficiency 01/12/2012   Asthma 08/22/2011   Daytime somnolence 08/22/2011   Essential hypertension 08/22/2011   Gastroesophageal reflux disease without esophagitis 08/22/2011   Lipid disorder 08/22/2011   Mood disorder (HCC) 08/22/2011   Multiple sclerosis (HCC) 08/22/2011   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   Pseudophakia of both eyes 07/28/2011   Nuclear sclerotic cataract of right eye 07/14/2011    Past Surgical History:  Procedure Laterality Date   CYSTOSCOPY W/ RETROGRADES Left 05/06/2019   Procedure: CYSTOSCOPY WITH RETROGRADE PYELOGRAM;  Surgeon: Riki Altes, MD;  Location: [facility];  Service: Urology;  Laterality: Left;   CYSTOSCOPY/URETEROSCOPY/HOLMIUM LASER/STENT PLACEMENT Left 05/06/2019   Procedure: CYSTOSCOPY/URETEROSCOPY/HOLMIUM LASER/STENT PLACEMENT;  Surgeon: Riki Altes, MD;  Location: [facility];  Service: Urology;  Laterality: Left;       Home Medications    Prior to Admission medications   Medication Sig Start  Date End Date Taking? Authorizing Provider  albuterol (PROVENTIL) (2.5 MG/3ML) 0.083% nebulizer solution Take 2.5 mg by nebulization every 6 (six) hours as needed for wheezing or shortness of breath.  06/29/18  Yes [provider]  ALPRAZolam (XANAX) 0.25 MG tablet Take 0.25 mg by mouth 3 (three) times daily.  06/04/09   Yes [provider]  amLODipine (NORVASC) 10 MG tablet Take 10 mg by mouth daily. 05/13/19  Yes [provider]  atorvastatin (LIPITOR) 80 MG tablet Take 80 mg by mouth daily.  09/04/18  Yes [provider]  buPROPion (WELLBUTRIN SR) 100 MG 12 hr tablet Take 100 mg by mouth daily. At night   Yes [provider]  buPROPion (WELLBUTRIN SR) 150 MG 12 hr tablet Take 150 mg by mouth every morning. 05/08/19  Yes [provider]  carbidopa-levodopa (SINEMET IR) 25-100 MG tablet Take 1 tablet by mouth 3 (three) times daily. 08/13/20  Yes [provider]  Cholecalciferol (D2000 ULTRA STRENGTH) 50 MCG (2000 UT) CAPS Take 4,000 Units by mouth daily.    Yes [provider]  esomeprazole (NEXIUM) 40 MG capsule Take 40 mg by mouth 2 (two) times daily before a meal.  01/12/12  Yes [provider]  fluticasone (FLONASE) 50 MCG/ACT nasal spray Place 1 spray into both nostrils daily as needed for allergies.    Yes [provider]  Fluticasone-Umeclidin-Vilant 100-62.5-25 MCG/INH AEPB Inhale 1 puff into the lungs daily.  09/26/16  Yes [provider]  gabapentin (NEURONTIN) 600 MG tablet Take 1,800 mg by mouth at bedtime.  06/04/09  Yes [provider]  losartan (COZAAR) 100 MG tablet Take 100 mg by mouth daily.  11/04/18  Yes [provider]  meclizine (ANTIVERT) 25 MG tablet Take 25 mg by mouth 3 (three) times daily as needed for dizziness.  06/04/09  Yes [provider]  Multiple Vitamin (MULTI-VITAMIN) tablet Take 1 tablet by mouth daily.    Yes [provider]  ondansetron (ZOFRAN) 4 MG tablet Take 1 tablet (4 mg total) by mouth every 8 (eight) hours as needed. 04/20/19  Yes Phineas Semen, MD  oxybutynin (DITROPAN) 5 MG tablet 1 tab tid prn frequency,urgency, bladder spasm 05/06/19  Yes Stoioff, Verna Czech, MD  potassium chloride SA (KLOR-CON) 20 MEQ tablet Take 60 mEq by mouth 2 (two) times daily.   04/29/12  Yes [provider]  primidone (MYSOLINE) 50 MG tablet Take 1 tablet by mouth 2 (two) times daily. 09/08/20 09/08/21 Yes [provider]  sertraline (ZOLOFT) 100 MG tablet Take 100 mg by mouth daily.  06/04/09  Yes [provider]  tadalafil (CIALIS) 5 MG tablet Take 5 mg by mouth daily. 08/28/20  Yes [provider]  tamsulosin (FLOMAX) 0.4 MG CAPS capsule Take 1 capsule (0.4 mg total) by mouth daily. 04/20/19  Yes Phineas Semen, MD  topiramate (TOPAMAX) 25 MG tablet Take 25 mg by mouth 2 (two) times daily. 09/01/20  Yes [provider]  zolpidem (AMBIEN CR) 12.5 MG CR tablet Take 12.5 mg by mouth at bedtime as needed for sleep.    Yes [provider]  spironolactone (ALDACTONE) 25 MG tablet Take 25 mg by mouth daily.  08/14/18 08/14/19  [provider]    Family History History reviewed. No pertinent family history.  Social History Social History   Tobacco Use   Smoking status: Former   Smokeless tobacco: Never  Building services engineer Use: Never used  Substance Use Topics  Alcohol use: Yes    Comment: beer with dinner   Drug use: Never     Allergies   Latex, Penicillins, and Doxycycline   Review of Systems Review of Systems  Constitutional:  Negative for activity change, appetite change, chills, diaphoresis, fatigue and fever.  HENT:  Positive for hearing loss. Negative for congestion, ear discharge, ear pain, facial swelling, nosebleeds, postnasal drip, rhinorrhea, sinus pressure, sinus pain, sore throat and trouble swallowing.   Eyes:  Negative for photophobia, pain, discharge, redness and visual disturbance.  Respiratory:  Negative for cough and chest tightness.   Gastrointestinal:  Negative for nausea and vomiting.  Musculoskeletal:  Positive for arthralgias, myalgias and neck pain. Negative for neck stiffness.  Skin:  Positive for color change and wound.  Allergic/Immunologic: Positive for environmental  allergies. Negative for food allergies.  Neurological:  Positive for tremors. Negative for dizziness, seizures, syncope, speech difficulty, light-headedness and headaches.  Hematological:  Negative for adenopathy. Does not bruise/bleed easily.    Physical Exam Triage Vital Signs ED Triage Vitals  Enc Vitals Group     BP 10/05/20 1427 (!) 141/66     Pulse Rate 10/05/20 1427 70     Resp 10/05/20 1427 18     Temp 10/05/20 1427 98 F (36.7 C)     Temp Source 10/05/20 1427 Oral     SpO2 10/05/20 1427 97 %     Weight 10/05/20 1425 215 lb (97.5 kg)     Height 10/05/20 1425 5\' 10"  (1.778 m)     Head Circumference --      Peak Flow --      Pain Score 10/05/20 1424 5     Pain Loc --      Pain Edu? --      Excl. in GC? --    No data found.  Updated Vital Signs BP (!) 141/66 (BP Location: Left Arm)   Pulse 70   Temp 98 F (36.7 C) (Oral)   Resp 18   Ht 5\' 10"  (1.778 m)   Wt 215 lb (97.5 kg)   SpO2 97%   BMI 30.85 kg/m   Visual Acuity Right Eye Distance:   Left Eye Distance:   Bilateral Distance:    Right Eye Near:   Left Eye Near:    Bilateral Near:     Physical Exam Vitals and nursing note reviewed.  Constitutional:      General: He is awake. He is not in acute distress.    Appearance: He is well-developed and well-groomed.     Comments: He is sitting comfortably on the exam table in no acute distress, talking in complete sentences.   HENT:     Head: Normocephalic. Abrasion, contusion and laceration present. No raccoon eyes, right periorbital erythema or left periorbital erythema.     Jaw: There is normal jaw occlusion.      Comments: Small 76mm straight laceration minimally deep across bridge of nose. No longer bleeding. Not tender. Area has scabbed over. Bruising present of size of nose pieces of glasses on either side of upper nose. Also not tender. A few small areas of superficial abrasions present on left upper forehead and scalp as well as a few on left cheek.      Right Ear: Tympanic membrane, ear canal and external ear normal. Decreased hearing noted.     Left Ear: Tympanic membrane, ear canal and external ear normal. Decreased hearing noted.     Nose: Signs of injury present. No nasal  tenderness or rhinorrhea.     Right Nostril: No epistaxis.     Left Nostril: No epistaxis.     Right Sinus: No maxillary sinus tenderness or frontal sinus tenderness.     Left Sinus: No maxillary sinus tenderness or frontal sinus tenderness.      Mouth/Throat:     Lips: Pink.     Mouth: Mucous membranes are moist.     Pharynx: Oropharynx is clear. Uvula midline.  Eyes:     Extraocular Movements: Extraocular movements intact.     Conjunctiva/sclera: Conjunctivae normal.     Pupils: Pupils are equal, round, and reactive to light.  Neck:      Comments: Has full range of motion of neck but pain, mainly with extension of neck. No muscle spasms present. No swelling or tenderness to palpation.  Cardiovascular:     Rate and Rhythm: Normal rate.  Pulmonary:     Effort: Pulmonary effort is normal.  Musculoskeletal:     Cervical back: Normal range of motion and neck supple. Tenderness present. No rigidity. Muscular tenderness present. Normal range of motion.  Skin:    General: Skin is warm and dry.     Capillary Refill: Capillary refill takes less than 2 seconds.     Findings: Abrasion, bruising, signs of injury and laceration present. No burn, erythema or petechiae.  Neurological:     General: No focal deficit present.     Mental Status: He is alert and oriented to person, place, and time.     Cranial Nerves: Cranial nerves are intact.     Motor: Tremor present.  Psychiatric:        Mood and Affect: Mood normal.        Behavior: Behavior normal. Behavior is cooperative.        Thought Content: Thought content normal.        Judgment: Judgment normal.     UC Treatments / Results  Labs (all labs ordered are listed, but only abnormal results are displayed) Labs  Reviewed - No data to display  EKG   Radiology No results found.  Procedures Procedures (including critical care time)  Medications Ordered in UC Medications - No data to display  Initial Impression / Assessment and Plan / UC Course  I have reviewed the triage vital signs and the nursing notes.  Pertinent labs & imaging results that were available during my care of the patient were reviewed by me and considered in my medical decision making (see chart for details).     Reviewed that he has a small laceration on bridge of nose- edges well approximated, bleeding controlled and wound is starting to scab over. Cleaned area with wound cleaner and applied 2 small steri-stirps for support and protection. Keep steri-strips on for next 4 to 5 days. No change neurologically from baseline. Patient is stable and can be discharged to home. Continue to clean other areas of face with soap and water. Discussed that he probably has a mild neck muscle strain from hitting ground/mud. May take Tylenol 1000mg  every 8 hours as needed for neck pain. Continue to monitor pain and symptoms. If any redness, swelling or discharge occurs from wound, return for recheck. Also if any dizziness, change in vision, headache, or vomiting occur, go to the ER ASAP. Otherwise, follow-up with your PCP as planned.  Final Clinical Impressions(s) / UC Diagnoses   Final diagnoses:  Contusion of face, initial encounter  Laceration of nose, initial encounter  Neck strain, initial encounter  Discharge Instructions      Recommend keep steri-strips on nose for next 5 days. Continue to clean other areas of face with soap and water. May take Tylenol 1000mg  every 6 to 8 hours as needed for pain. Continue to monitor pain and symptoms. If any redness, increased swelling, or discharge occurs from cut on your nose, return for recheck. Otherwise, follow-up as needed.      ED Prescriptions   None    PDMP not reviewed this  encounter.   Sudie Grumbling, NP 10/06/20 1406

## 2021-10-04 ENCOUNTER — Ambulatory Visit
Admission: EM | Admit: 2021-10-04 | Discharge: 2021-10-04 | Disposition: A | Discharge status: Home / Self Care | Payer: Medicare Other | Attending: Emergency Medicine | Admitting: Emergency Medicine

## 2021-10-04 DIAGNOSIS — R350 Frequency of micturition: Secondary | ICD-10-CM | POA: Diagnosis present

## 2021-10-04 DIAGNOSIS — R300 Dysuria: Secondary | ICD-10-CM

## 2021-10-04 LAB — URINALYSIS, MICROSCOPIC (REFLEX)

## 2021-10-04 LAB — URINALYSIS, ROUTINE W REFLEX MICROSCOPIC
Bilirubin Urine: NEGATIVE
Glucose, UA: NEGATIVE mg/dL
Ketones, ur: NEGATIVE mg/dL
Nitrite: NEGATIVE
Protein, ur: NEGATIVE mg/dL
Specific Gravity, Urine: 1.01 (ref 1.005–1.030)
pH: 7 (ref 5.0–8.0)

## 2021-10-04 MED ORDER — SULFAMETHOXAZOLE-TRIMETHOPRIM 800-160 MG PO TABS: 1.0000 | ORAL_TABLET | Freq: Two times a day (BID) | ORAL | 0 refills | Status: AC

## 2021-10-04 MED ORDER — PHENAZOPYRIDINE HCL 200 MG PO TABS: 200.0000 mg | ORAL_TABLET | Freq: Three times a day (TID) | ORAL | 0 refills | Status: DC | PRN

## 2021-10-04 NOTE — ED Provider Notes (Signed)
[facility]    CSN: [id number] Arrival date & time: 10/04/21  1730      History   Chief Complaint Chief Complaint  Patient presents with   Urinary Frequency   Dysuria    HPI Jordan [name] is a 76 y.o. male.   76 year old male presents with increase in urinary frequency and pain with urination that started this afternoon. Has history of recurrent UTI. Last UTI about 1 year ago. Currently has right non-obstructing renal calculi that was dx on 08/29/2021 along with bilateral renal cysts. No current gross blood. No flank pain. No fever or penile discharge. Has been taking Cranberry pills and trying to increase water with minimal relief. Other chronic health issues include HTN, hyperlipidemia, MS, GERD, COPD, anemia, restless leg, BPH, migraines, mood disorder and asthma.  Currently on Norvasc, Lipitor, Nexium, losartan, Aldactone, potassium, gabapentin, Ditropan, Zoloft, Wellbutrin, Cialis, and Trelegy daily and Ambien, meclizine, Flonase, Xanax and albuterol as needed  The history is provided by the patient.    Past Medical History:  Diagnosis Date   Anemia 2018   Anxiety    COPD (chronic obstructive pulmonary disease) (HCC)    Depression    GERD (gastroesophageal reflux disease)    History of kidney stones    Hypertension    Kidney stone    MS (multiple sclerosis) ([place])    Restless leg     Patient Active Problem List   Diagnosis Date Noted   Substernal chest pain 09/02/2018   Hypokalemia 09/17/2017   Traumatic complete tear of left rotator cuff 07/10/2017   Restless leg 05/10/2017   Acute exacerbation of chronic obstructive pulmonary disease (COPD) ([place]) 03/21/2017   CAP (community acquired pneumonia) 03/20/2017   Benign prostatic hyperplasia without lower urinary tract symptoms 01/12/2016   Erectile dysfunction 01/12/2016   Bronchiectasis without complication ([place]) 62/69/4854   Abnormal echocardiogram 04/16/2014   Abnormal nuclear stress test 04/16/2014    Anxiety 10/02/2012   History of adenomatous polyp of colon 10/02/2012   Chronic laryngitis 03/05/2012   Liver lesion 03/05/2012   Thyroid nodule 03/05/2012   Encounter for long-term (current) use of other medications 01/12/2012   History of migraine 01/12/2012   History of nephrolithiasis 01/12/2012   Vitamin D deficiency 01/12/2012   Asthma 08/22/2011   Daytime somnolence 08/22/2011   Essential hypertension 08/22/2011   Gastroesophageal reflux disease without esophagitis 08/22/2011   Lipid disorder 08/22/2011   Mood disorder ([place]) 08/22/2011   Multiple sclerosis ([place]) 08/22/2011   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   Pseudophakia of both eyes 07/28/2011   Nuclear sclerotic cataract of right eye 07/14/2011    Past Surgical History:  Procedure Laterality Date   CYSTOSCOPY W/ RETROGRADES Left 05/06/2019   Procedure: CYSTOSCOPY WITH RETROGRADE PYELOGRAM;  Surgeon: Abbie Sons, MD;  Location: [facility];  Service: Urology;  Laterality: Left;   CYSTOSCOPY/URETEROSCOPY/HOLMIUM LASER/STENT PLACEMENT Left 05/06/2019   Procedure: CYSTOSCOPY/URETEROSCOPY/HOLMIUM LASER/STENT PLACEMENT;  Surgeon: Abbie Sons, MD;  Location: [facility];  Service: Urology;  Laterality: Left;       Home Medications    Prior to Admission medications   Medication Sig Start Date End Date Taking? Authorizing Provider  phenazopyridine (PYRIDIUM) 200 MG tablet Take 1 tablet (200 mg total) by mouth 3 (three) times daily as needed for pain. 10/04/21  Yes Tiajah Oyster, Nicholes Stairs, NP  sulfamethoxazole-trimethoprim (BACTRIM DS) 800-160 MG tablet Take 1 tablet by mouth 2 (two) times daily for 7 days. 10/04/21 10/11/21 Yes Brittanny Levenhagen, Nicholes Stairs,  NP  albuterol (PROVENTIL) (2.5 MG/3ML) 0.083% nebulizer solution Take 2.5 mg by nebulization every 6 (six) hours as needed for wheezing or shortness of breath.  06/29/18   [provider]  ALPRAZolam Prudy Feeler) 0.25 MG tablet Take 0.25 mg by mouth 3 (three) times daily.  06/04/09    [provider]  amLODipine (NORVASC) 10 MG tablet Take 10 mg by mouth daily. 05/13/19   [provider]  atorvastatin (LIPITOR) 80 MG tablet Take 80 mg by mouth daily.  09/04/18   [provider]  buPROPion (WELLBUTRIN SR) 100 MG 12 hr tablet Take 100 mg by mouth daily. At night    [provider]  buPROPion (WELLBUTRIN SR) 150 MG 12 hr tablet Take 150 mg by mouth every morning. 05/08/19   [provider]  carbidopa-levodopa (SINEMET IR) 25-100 MG tablet Take 1 tablet by mouth 3 (three) times daily. 08/13/20   [provider]  Cholecalciferol (D2000 ULTRA STRENGTH) 50 MCG (2000 UT) CAPS Take 4,000 Units by mouth daily.     [provider]  esomeprazole (NEXIUM) 40 MG capsule Take 40 mg by mouth 2 (two) times daily before a meal.  01/12/12   [provider]  fluticasone (FLONASE) 50 MCG/ACT nasal spray Place 1 spray into both nostrils daily as needed for allergies.     [provider]  Fluticasone-Umeclidin-Vilant 100-62.5-25 MCG/INH AEPB Inhale 1 puff into the lungs daily.  09/26/16   [provider]  gabapentin (NEURONTIN) 600 MG tablet Take 1,800 mg by mouth at bedtime.  06/04/09   [provider]  losartan (COZAAR) 100 MG tablet Take 100 mg by mouth daily.  11/04/18   [provider]  meclizine (ANTIVERT) 25 MG tablet Take 25 mg by mouth 3 (three) times daily as needed for dizziness.  06/04/09   [provider]  Multiple Vitamin (MULTI-VITAMIN) tablet Take 1 tablet by mouth daily.     [provider]  oxybutynin (DITROPAN) 5 MG tablet 1 tab tid prn frequency,urgency, bladder spasm 05/06/19   Stoioff, Verna Czech, MD  potassium chloride SA (KLOR-CON) 20 MEQ tablet Take 60 mEq by mouth 2 (two) times daily.  04/29/12   [provider]  sertraline (ZOLOFT) 100 MG tablet Take 100 mg by mouth daily.  06/04/09   [provider]  spironolactone (ALDACTONE) 25 MG tablet Take 25 mg by  mouth daily.  08/14/18 08/14/19  [provider]  tadalafil (CIALIS) 5 MG tablet Take 5 mg by mouth daily. 08/28/20   [provider]  topiramate (TOPAMAX) 25 MG tablet Take 25 mg by mouth 2 (two) times daily. 09/01/20   [provider]  zolpidem (AMBIEN CR) 12.5 MG CR tablet Take 12.5 mg by mouth at bedtime as needed for sleep.     [provider]    Family History History reviewed. No pertinent family history.  Social History Social History   Tobacco Use   Smoking status: Former   Smokeless tobacco: Never  Building services engineer Use: Never used  Substance Use Topics   Alcohol use: Yes    Comment: beer with dinner   Drug use: Never     Allergies   Latex, Penicillins, Primidone, and Doxycycline   Review of Systems Review of Systems  Constitutional:  Negative for activity change, appetite change, chills and fever.  Gastrointestinal:  Negative for abdominal pain, nausea and vomiting.  Genitourinary:  Positive for dysuria and frequency. Negative for decreased urine volume, flank pain,  genital sores, hematuria, penile discharge, penile pain and testicular pain.  Musculoskeletal:  Positive for arthralgias and myalgias. Negative for back pain.  Skin:  Negative for color change and rash.  Allergic/Immunologic: Negative for environmental allergies and food allergies.  Neurological:  Negative for dizziness, seizures, syncope and speech difficulty.  Hematological:  Negative for adenopathy. Does not bruise/bleed easily.  Psychiatric/Behavioral:  Positive for sleep disturbance.      Physical Exam Triage Vital Signs ED Triage Vitals  Enc Vitals Group     BP 10/04/21 1827 (!) 147/82     Pulse Rate 10/04/21 1827 74     Resp --      Temp 10/04/21 1827 98.2 F (36.8 C)     Temp Source 10/04/21 1827 Oral     SpO2 10/04/21 1827 97 %     Weight 10/04/21 1826 221 lb (100.2 kg)     Height 10/04/21 1826 6' (1.829 m)     Head Circumference --      Peak  Flow --      Pain Score 10/04/21 1826 5     Pain Loc --      Pain Edu? --      Excl. in GC? --    No data found.  Updated Vital Signs BP (!) 147/82 (BP Location: Right Arm)   Pulse 74   Temp 98.2 F (36.8 C) (Oral)   Ht 6' (1.829 m)   Wt 221 lb (100.2 kg)   SpO2 97%   BMI 29.97 kg/m   Visual Acuity Right Eye Distance:   Left Eye Distance:   Bilateral Distance:    Right Eye Near:   Left Eye Near:    Bilateral Near:     Physical Exam Vitals and nursing note reviewed.  Constitutional:      General: He is awake. He is not in acute distress.    Appearance: He is well-developed and overweight.     Comments: He is sitting comfortably on the exam table in no acute distress.   HENT:     Head: Normocephalic and atraumatic.     Right Ear: Hearing normal.     Left Ear: Hearing normal.  Cardiovascular:     Rate and Rhythm: Normal rate.  Pulmonary:     Effort: Pulmonary effort is normal.  Abdominal:     General: Abdomen is protuberant.     Palpations: Abdomen is soft.     Tenderness: There is no abdominal tenderness. There is no right CVA tenderness or left CVA tenderness.  Skin:    General: Skin is warm and dry.     Findings: No rash.  Neurological:     General: No focal deficit present.     Mental Status: He is alert and oriented to person, place, and time.  Psychiatric:        Mood and Affect: Mood normal.        Behavior: Behavior normal. Behavior is cooperative.        Thought Content: Thought content normal.        Judgment: Judgment normal.      UC Treatments / Results  Labs (all labs ordered are listed, but only abnormal results are displayed) Labs Reviewed  URINALYSIS, ROUTINE W REFLEX MICROSCOPIC - Abnormal; Notable for the following components:      Result Value   APPearance HAZY (*)    Hgb urine dipstick MODERATE (*)    Leukocytes,Ua MODERATE (*)    All other components within normal limits  URINALYSIS, MICROSCOPIC (REFLEX) - Abnormal; Notable for  the following components:   Bacteria, UA MANY (*)    All other components within normal limits  URINE CULTURE    EKG   Radiology No results found.  Procedures Procedures (including critical care time)  Medications Ordered in UC Medications - No data to display  Initial Impression / Assessment and Plan / UC Course  I have reviewed the triage vital signs and the nursing notes.  Pertinent labs & imaging results that were available during my care of the patient were reviewed by me and considered in my medical decision making (see chart for details).     Reviewed urinalysis results with patient- positive blood and WBC's and many bacteria under the microscope- probable UTI. Will send urine for culture. No signs of urinary obstruction. Since allergic to PCN and Doxycycline, will trial Bactrim DS twice a day as directed. Continue to push fluids and may continue Cranberry pills daily. May take Pyridium 200mg  every 8 hours as needed for urinary pain. Follow-up with the Urologist as needed and pending urine culture results.    Final Clinical Impressions(s) / UC Diagnoses   Final diagnoses:  Dysuria  Urinary frequency     Discharge Instructions      Recommend start Bactrim DS twice a day for 7 days. Continue to push fluids and continue Cranberry pills as needed. May take Pyridium 200mg  every 8 hours as needed for urinary pain. Recommend follow-up pending urine culture results.     ED Prescriptions     Medication Sig Dispense Auth. Provider   sulfamethoxazole-trimethoprim (BACTRIM DS) 800-160 MG tablet Take 1 tablet by mouth 2 (two) times daily for 7 days. 14 tablet Larayne Baxley, , NP   phenazopyridine (PYRIDIUM) 200 MG tablet Take 1 tablet (200 mg total) by mouth 3 (three) times daily as needed for pain. 12 tablet Jessy Cybulski, , NP      PDMP not reviewed this encounter.   Ali Lowe, NP 10/05/21 1144

## 2021-10-04 NOTE — ED Triage Notes (Signed)
Patient reports frequent urination and burning with urination -- started this afternoon.

## 2021-10-04 NOTE — Discharge Instructions (Addendum)
Recommend start Bactrim DS twice a day for 7 days. Continue to push fluids and continue Cranberry pills as needed. May take Pyridium 200mg  every 8 hours as needed for urinary pain. Recommend follow-up pending urine culture results.

## 2021-10-07 LAB — URINE CULTURE: Culture: >=100000 — AB

## 2022-01-21 IMAGING — CR DG RIBS W/ CHEST 3+V*R*
5 series · 5 of 5 positions shown · non-contrast
Comparison: None.

CLINICAL DATA: Status post fall. Right rib pain.

EXAM:
RIGHT RIBS AND CHEST - 3+ VIEW

[rib pa (1 of 3)]
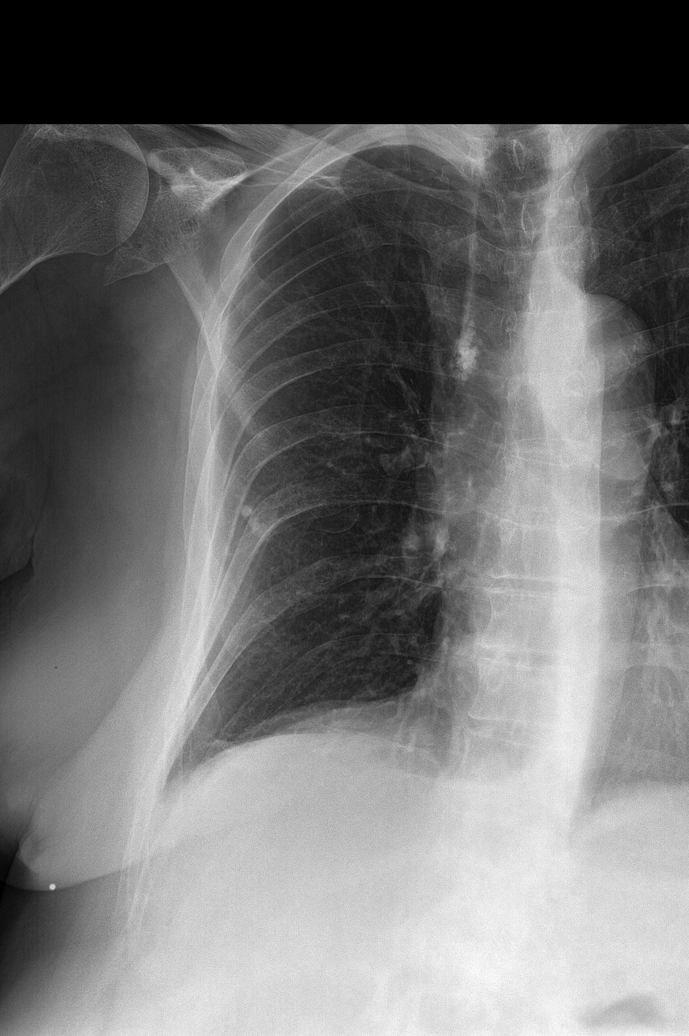

[rib pa (2 of 3)]
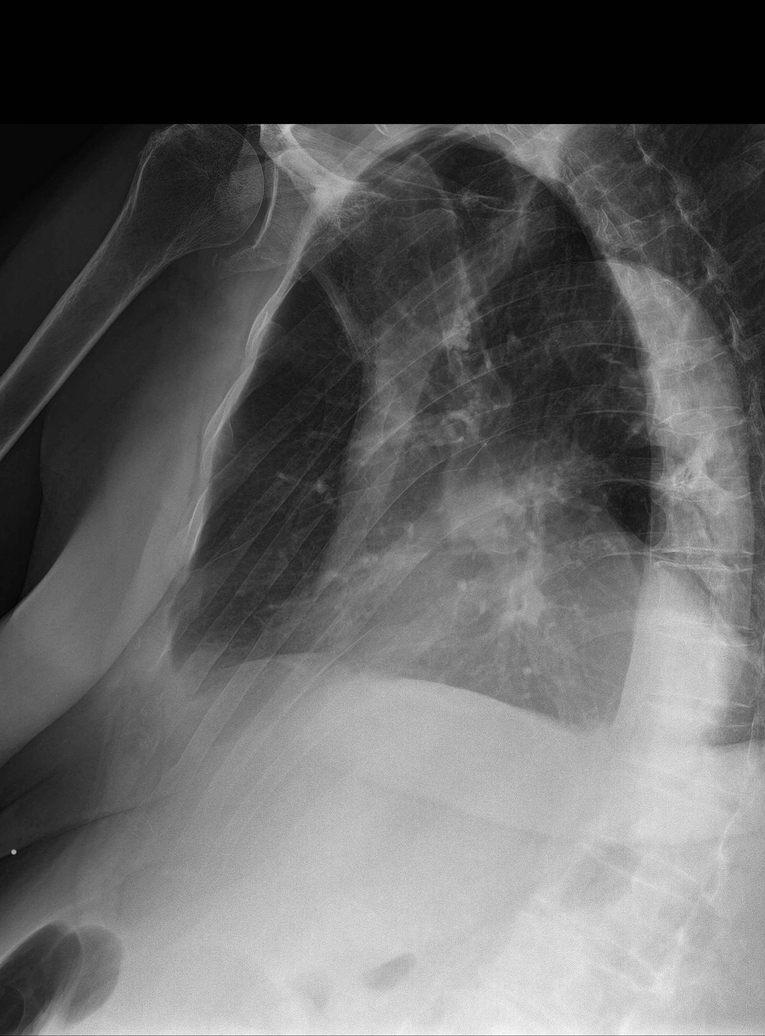

[rib obl]
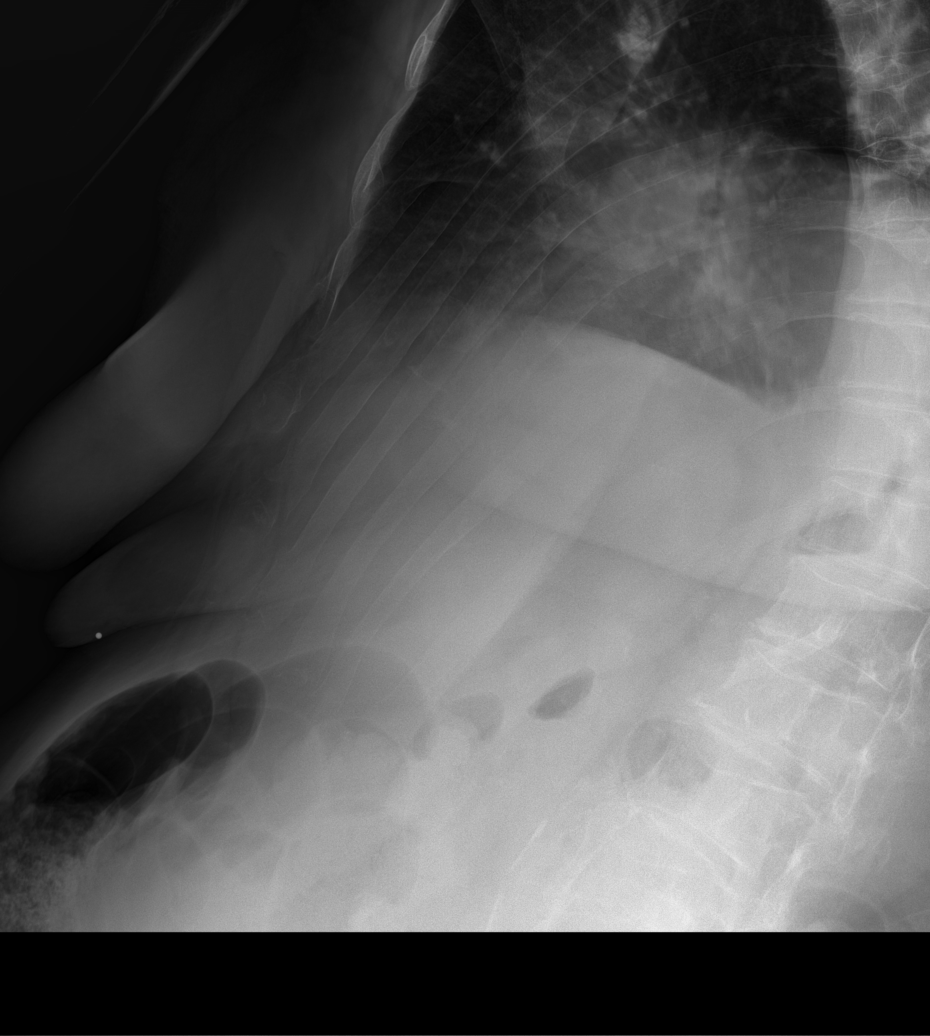

[chest pa]
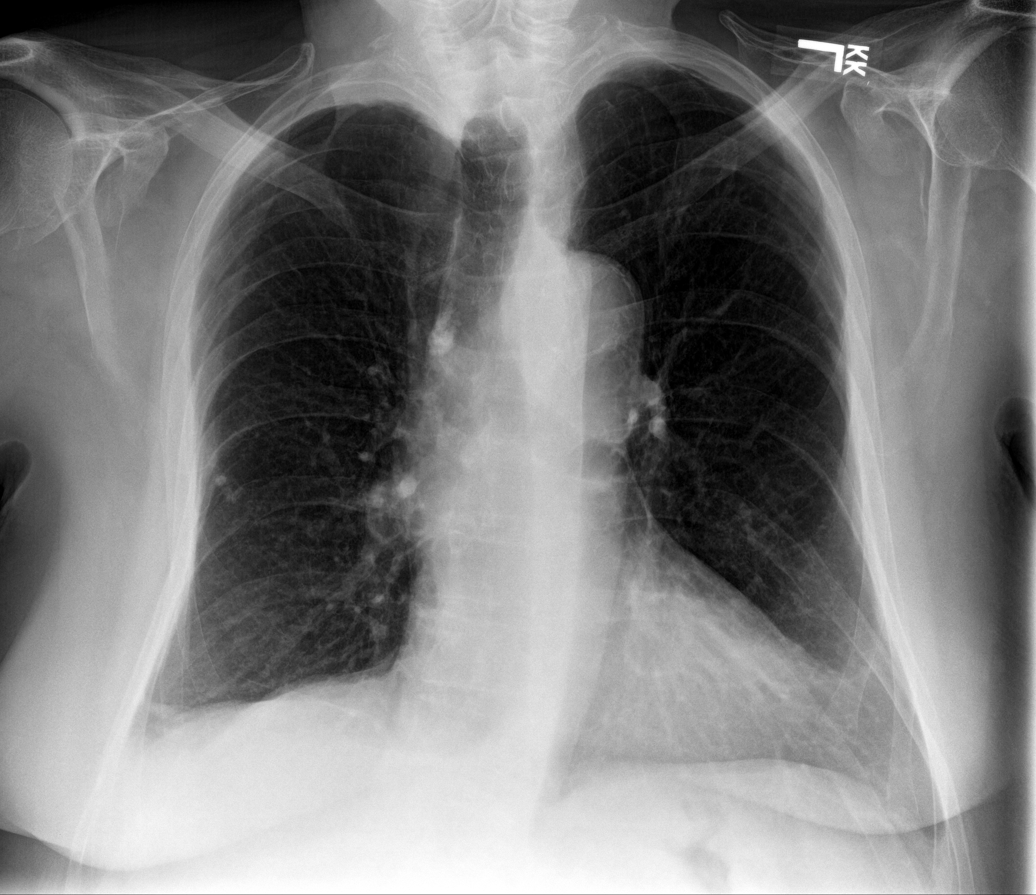

[rib pa (3 of 3)]
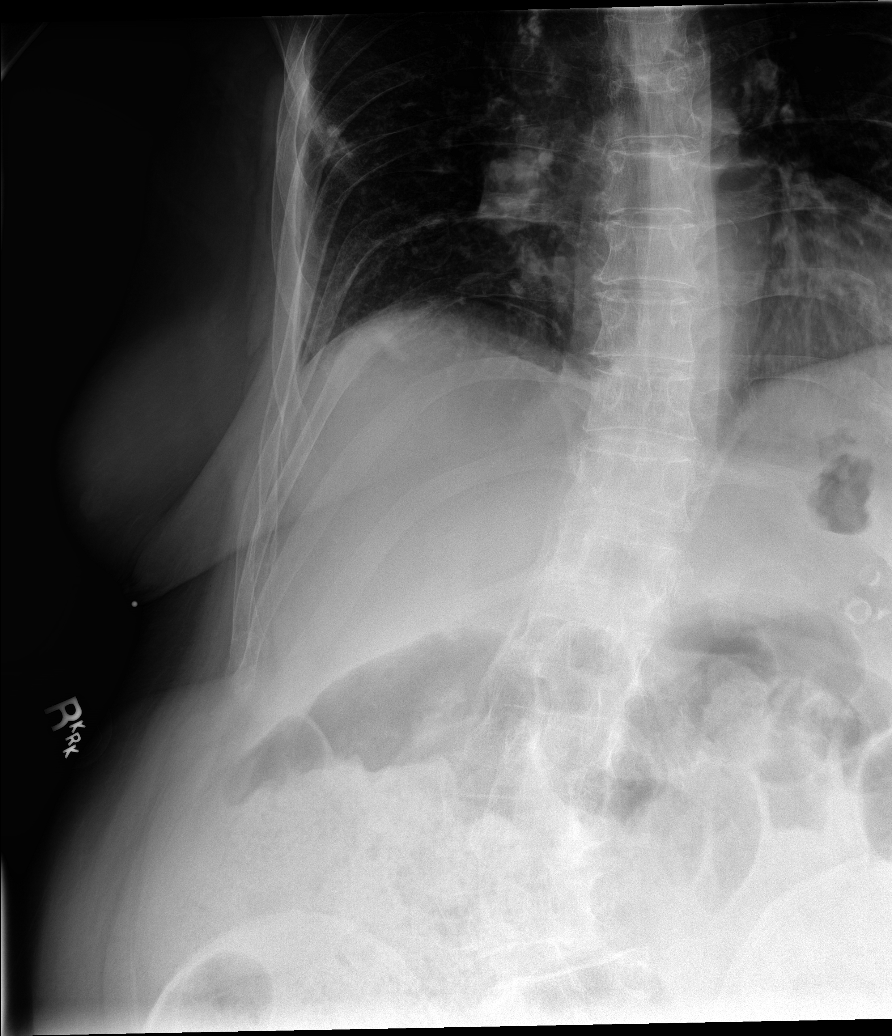

[5 of 5 positions shown; findings below may reference images not displayed]

FINDINGS: Nondisplaced fracture of the right anterior sixth rib adjacent to
the costochondral junction. There is no evidence of pneumothorax or
pleural effusion. Both lungs are clear. Heart size and mediastinal
contours are within normal limits.
IMPRESSION: Nondisplaced fracture of the right anterior sixth rib adjacent to
the costochondral junction.

## 2022-02-11 IMAGING — CT CT RENAL STONE PROTOCOL
2 of 4 series · 16 of 46 positions shown, 18 images · non-contrast
Comparison: None.

CLINICAL DATA: Acute left groin pain.

EXAM:
CT ABDOMEN AND PELVIS WITHOUT CONTRAST
TECHNIQUE: Multidetector CT imaging of the abdomen and pelvis was performed
following the standard protocol without IV contrast.

[Series 2: stone full standard · axial · 0.98mm/px · z∈[-487,-47]mm · 13 of 98 slices shown, 15 images]
[im 5/98  soft-tissue]
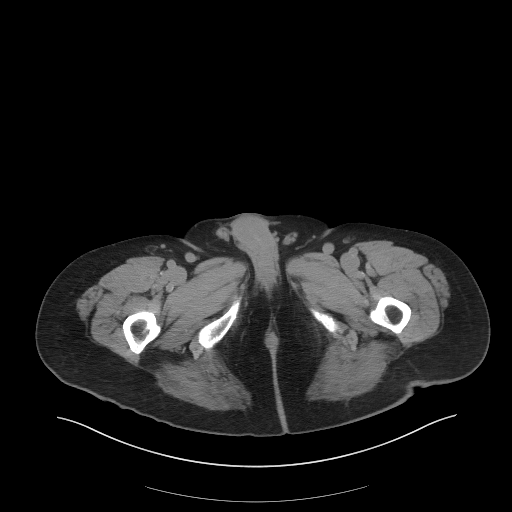
[im 5/98  bone]
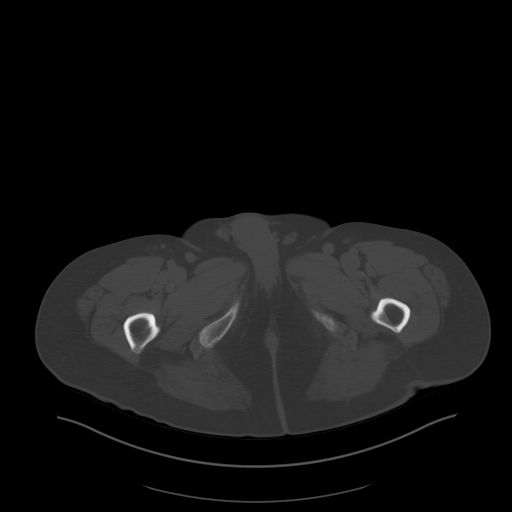
[im 13/98  soft-tissue]
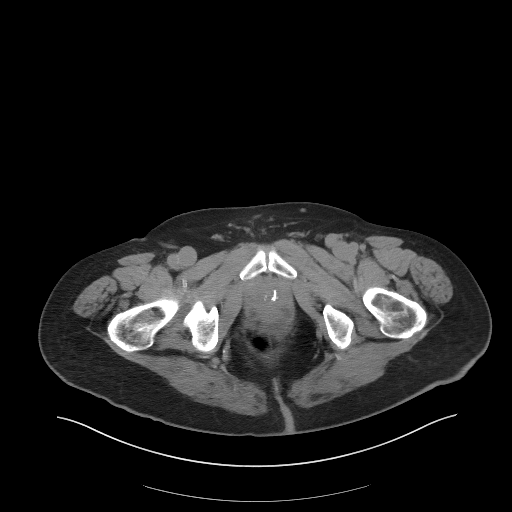
[im 22/98  soft-tissue]
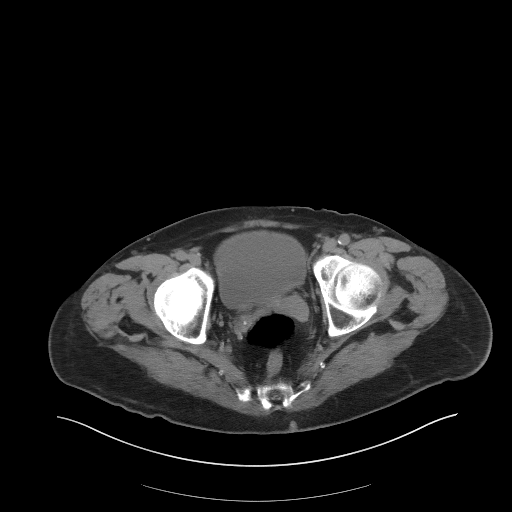
[im 26/98  soft-tissue]
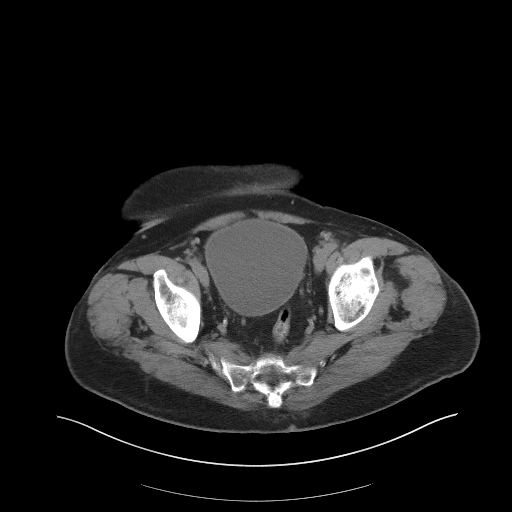
[im 34/98  soft-tissue]
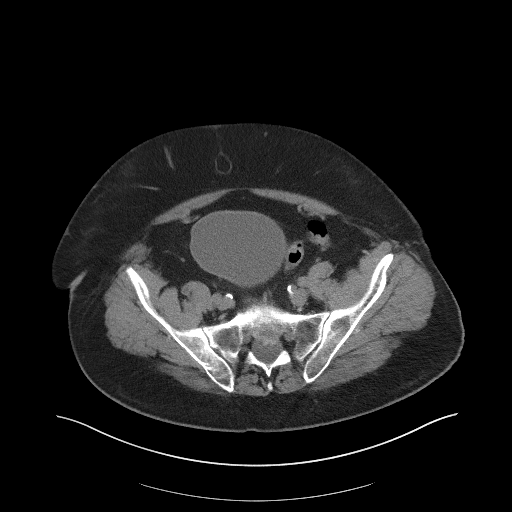
[im 43/98  soft-tissue]
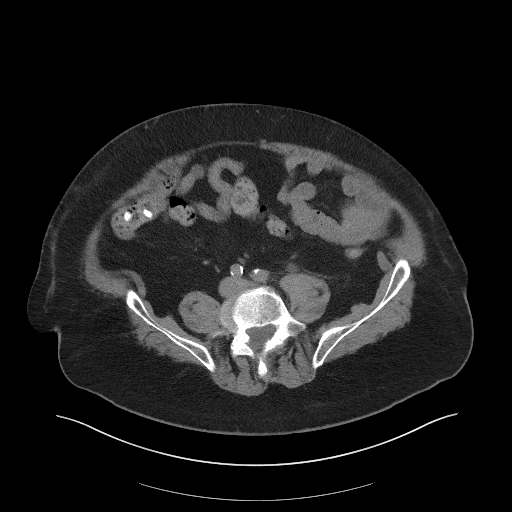
[im 51/98  soft-tissue]
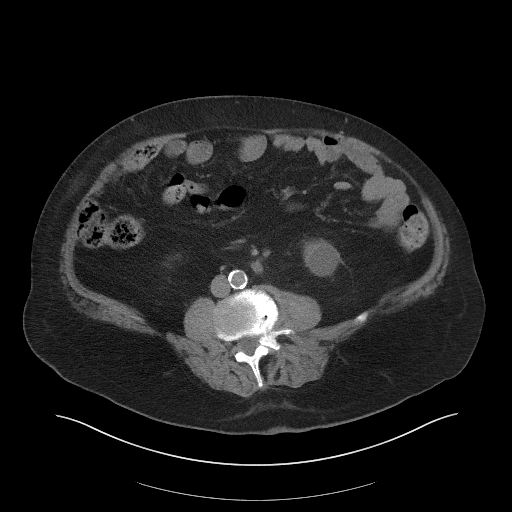
[im 55/98  soft-tissue]
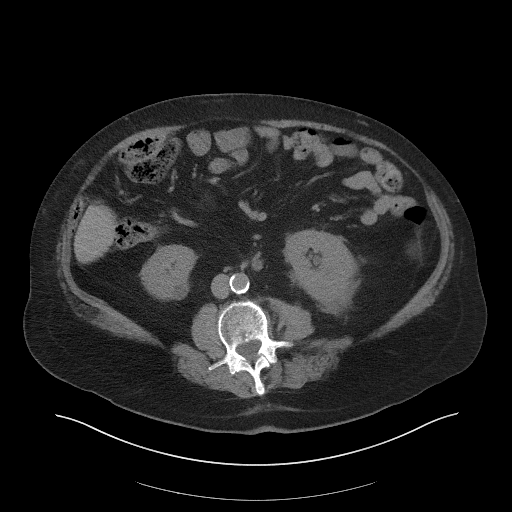
[im 64/98  soft-tissue]
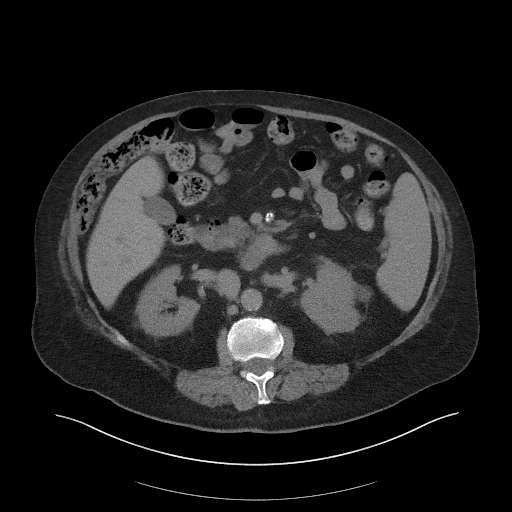
[im 64/98  bone]
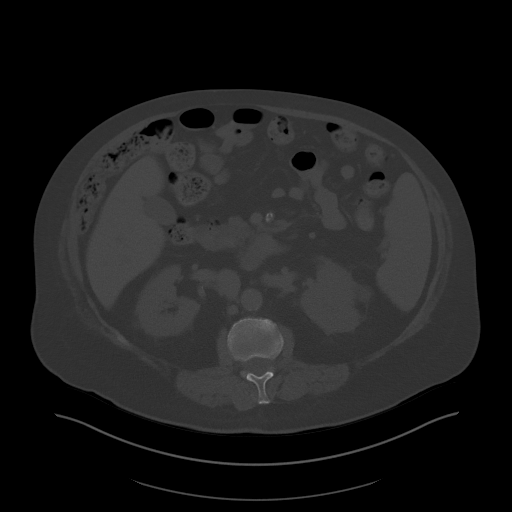
[im 72/98  soft-tissue]
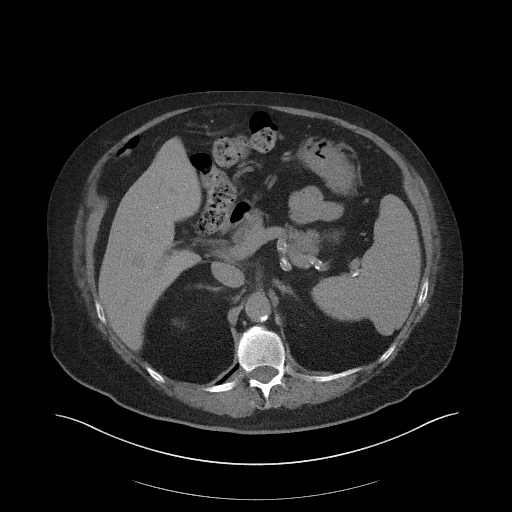
[im 76/98  soft-tissue]
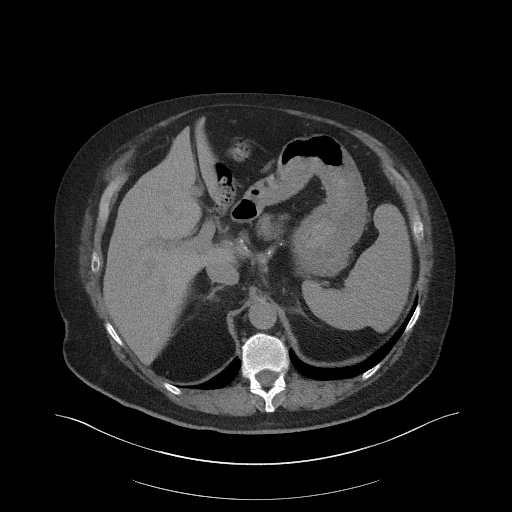
[im 85/98  soft-tissue]
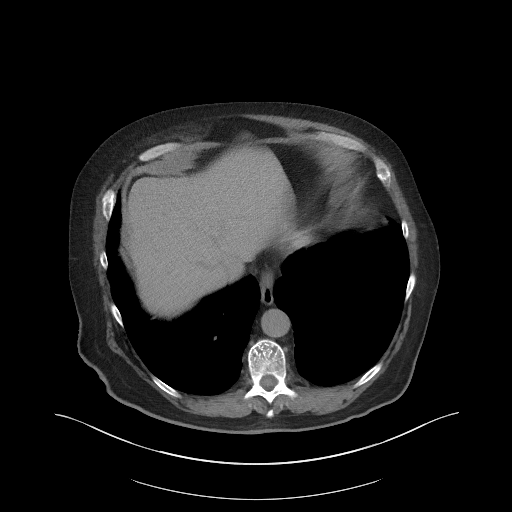
[im 93/98  soft-tissue]
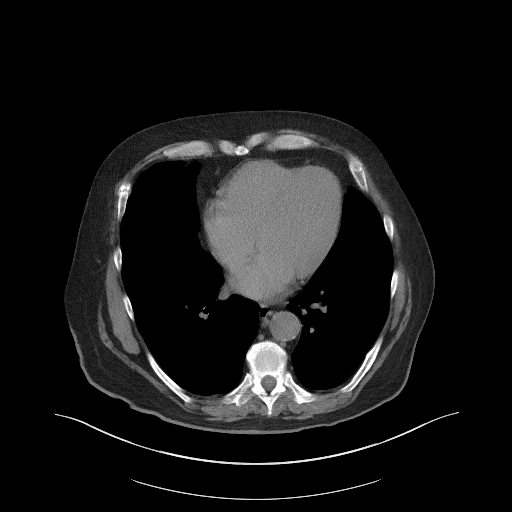

[Series 5: coronal · coronal · 0.85mm/px · 3 of 172 slices shown]
[im 58/172  soft-tissue]
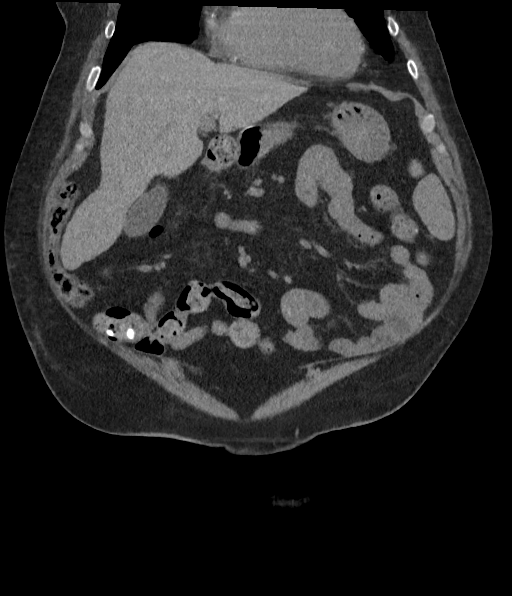
[im 77/172  soft-tissue]
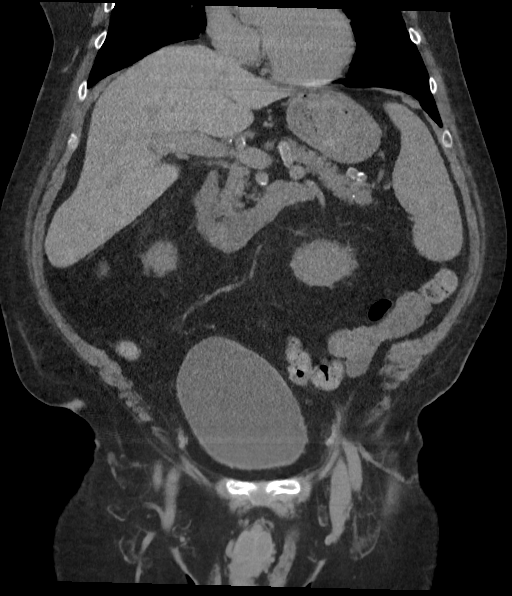
[im 96/172  soft-tissue]
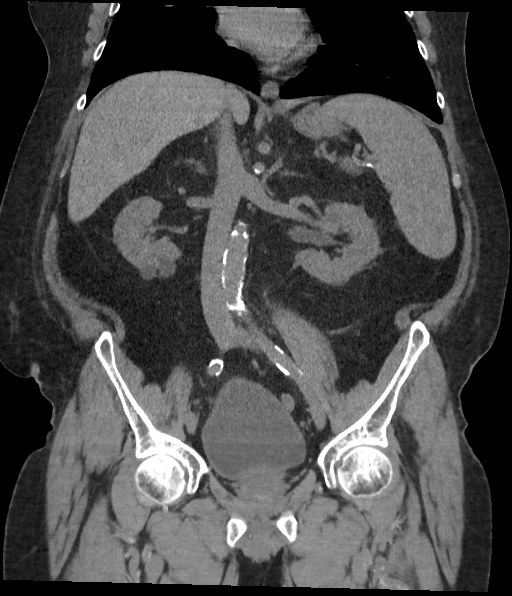

[16 of 46 positions shown; findings below may reference images not displayed]

FINDINGS: Lower chest: No acute abnormality.

Hepatobiliary: No focal liver abnormality is seen. No gallstones,
gallbladder wall thickening, or biliary dilatation.

Pancreas: Unremarkable. No pancreatic ductal dilatation or
surrounding inflammatory changes.

Spleen: Calcified splenic granulomata are noted.

Adrenals/Urinary Tract: Adrenal glands appear normal. Nonobstructive
right nephrolithiasis is noted. Exophytic right renal cyst is noted.
Multiple left renal cysts are noted. Mild left hydronephrosis is
noted secondary to 2 adjacent calculi in the proximal right ureter,
the largest measuring 1 cm. Perinephric stranding is noted on the
left. Urinary bladder is unremarkable.

Stomach/Bowel: Stomach is within normal limits. Appendix appears
normal. No evidence of bowel wall thickening, distention, or
inflammatory changes.

Vascular/Lymphatic: Aortic atherosclerosis. No enlarged abdominal or
pelvic lymph nodes.

Reproductive: Mild prostatic enlargement is noted.

Other: No abdominal wall hernia or abnormality. No abdominopelvic
ascites.

Musculoskeletal: No acute or significant osseous findings.
IMPRESSION: 1. Nonobstructive right nephrolithiasis.
2. Mild left hydronephrosis with perinephric stranding is noted
secondary to 2 adjacent calculi in the proximal right ureter, the
largest measuring 1 cm.
3. Mild prostatic enlargement.

Aortic Atherosclerosis ([id number]).

## 2022-07-06 ENCOUNTER — Emergency Department: Payer: Medicare Other

## 2022-07-06 ENCOUNTER — Other Ambulatory Visit: Payer: Self-pay

## 2022-07-06 ENCOUNTER — Emergency Department
Admission: EM | Admit: 2022-07-06 | Discharge: 2022-07-06 | Disposition: A | Discharge status: Other Acute Inpatient Hospital | Payer: Medicare Other | Attending: Student in an Organized Health Care Education/Training Program | Admitting: Student in an Organized Health Care Education/Training Program

## 2022-07-06 DIAGNOSIS — S42341A Displaced spiral fracture of shaft of humerus, right arm, initial encounter for closed fracture: Secondary | ICD-10-CM | POA: Insufficient documentation

## 2022-07-06 DIAGNOSIS — S0033XA Contusion of nose, initial encounter: Secondary | ICD-10-CM | POA: Diagnosis not present

## 2022-07-06 DIAGNOSIS — Y9301 Activity, walking, marching and hiking: Secondary | ICD-10-CM | POA: Insufficient documentation

## 2022-07-06 DIAGNOSIS — S02412A LeFort II fracture, initial encounter for closed fracture: Secondary | ICD-10-CM | POA: Insufficient documentation

## 2022-07-06 DIAGNOSIS — S299XXA Unspecified injury of thorax, initial encounter: Secondary | ICD-10-CM | POA: Insufficient documentation

## 2022-07-06 DIAGNOSIS — Y92481 Parking lot as the place of occurrence of the external cause: Secondary | ICD-10-CM | POA: Diagnosis not present

## 2022-07-06 DIAGNOSIS — S3991XA Unspecified injury of abdomen, initial encounter: Secondary | ICD-10-CM | POA: Insufficient documentation

## 2022-07-06 DIAGNOSIS — W01198A Fall on same level from slipping, tripping and stumbling with subsequent striking against other object, initial encounter: Secondary | ICD-10-CM | POA: Diagnosis not present

## 2022-07-06 DIAGNOSIS — S4991XA Unspecified injury of right shoulder and upper arm, initial encounter: Secondary | ICD-10-CM | POA: Diagnosis present

## 2022-07-06 DIAGNOSIS — S0083XA Contusion of other part of head, initial encounter: Secondary | ICD-10-CM | POA: Diagnosis not present

## 2022-07-06 LAB — CBC WITH DIFFERENTIAL/PLATELET
Abs Immature Granulocytes: 0.04 10*3/uL (ref 0.00–0.07)
Basophils Absolute: 0 10*3/uL (ref 0.0–0.1)
Basophils Relative: 0 %
Eosinophils Absolute: 0 10*3/uL (ref 0.0–0.5)
Eosinophils Relative: 0 %
HCT: 38.2 % — ABNORMAL LOW (ref 39.0–52.0)
Hemoglobin: 12.6 g/dL — ABNORMAL LOW (ref 13.0–17.0)
Immature Granulocytes: 0 %
Lymphocytes Relative: 7 %
Lymphs Abs: 0.7 10*3/uL (ref 0.7–4.0)
MCH: 31.1 pg (ref 26.0–34.0)
MCHC: 33 g/dL (ref 30.0–36.0)
MCV: 94.3 fL (ref 80.0–100.0)
Monocytes Absolute: 0.6 10*3/uL (ref 0.1–1.0)
Monocytes Relative: 5 %
Neutro Abs: 8.8 10*3/uL — ABNORMAL HIGH (ref 1.7–7.7)
Neutrophils Relative %: 88 %
Platelets: 139 10*3/uL — ABNORMAL LOW (ref 150–400)
RBC: 4.05 MIL/uL — ABNORMAL LOW (ref 4.22–5.81)
RDW: 13 % (ref 11.5–15.5)
WBC: 10.2 10*3/uL (ref 4.0–10.5)
nRBC: 0 % (ref 0.0–0.2)

## 2022-07-06 LAB — COMPREHENSIVE METABOLIC PANEL
ALT: 20 U/L (ref 0–44)
AST: 26 U/L (ref 15–41)
Albumin: 3.7 g/dL (ref 3.5–5.0)
Alkaline Phosphatase: 56 U/L (ref 38–126)
Anion gap: 7 (ref 5–15)
BUN: 15 mg/dL (ref 8–23)
CO2: 19 mmol/L — ABNORMAL LOW (ref 22–32)
Calcium: 7.8 mg/dL — ABNORMAL LOW (ref 8.9–10.3)
Chloride: 107 mmol/L (ref 98–111)
Creatinine, Ser: 0.75 mg/dL (ref 0.61–1.24)
GFR, Estimated: >60 mL/min (ref >60)
Glucose, Bld: 114 mg/dL — ABNORMAL HIGH (ref 70–99)
Potassium: 3.6 mmol/L (ref 3.5–5.1)
Sodium: 133 mmol/L — ABNORMAL LOW (ref 135–145)
Total Bilirubin: 1.1 mg/dL (ref 0.3–1.2)
Total Protein: 6 g/dL — ABNORMAL LOW (ref 6.5–8.1)

## 2022-07-06 LAB — TYPE AND SCREEN
ABO/RH(D): O POS
Antibody Screen: NEGATIVE

## 2022-07-06 LAB — TROPONIN I (HIGH SENSITIVITY): Troponin I (High Sensitivity): 11 ng/L (ref <18)

## 2022-07-06 MED ORDER — ONDANSETRON HCL 4 MG/2ML IJ SOLN
4.0000 mg | Freq: Once | INTRAMUSCULAR | Status: AC
  Administered 2022-07-06: 4 mg via INTRAVENOUS
  Filled 2022-07-06: qty 2

## 2022-07-06 MED ORDER — SODIUM CHLORIDE 0.9 % IV SOLN: Freq: Once | INTRAVENOUS | Status: AC

## 2022-07-06 MED ORDER — IOHEXOL 300 MG/ML  SOLN
100.0000 mL | Freq: Once | INTRAMUSCULAR | Status: AC | PRN
  Administered 2022-07-06: 100 mL via INTRAVENOUS

## 2022-07-06 MED ORDER — FENTANYL CITRATE PF 50 MCG/ML IJ SOSY
50.0000 ug | PREFILLED_SYRINGE | Freq: Once | INTRAMUSCULAR | Status: AC
  Administered 2022-07-06: 50 ug via INTRAVENOUS
  Filled 2022-07-06: qty 1

## 2022-07-06 MED ORDER — MORPHINE SULFATE (PF) 4 MG/ML IV SOLN
4.0000 mg | INTRAVENOUS | Status: DC | PRN
  Administered 2022-07-06: 4 mg via INTRAVENOUS
  Filled 2022-07-06: qty 1

## 2022-07-06 NOTE — ED Triage Notes (Signed)
First RN note-  Pt BIB [facility] Pt was walking out of store and tripped over a bump and fell face first into concrete. Pt has a laceration to the nose and complaining of right arm pain. Pt denies neck or back pain, not on blood thinners. Pt denied loss of consiousness  20G ,Left ac   EMS vitals BP-151/64 HR-82 O2-98% 142cbg

## 2022-07-06 NOTE — ED Notes (Signed)
[facility]  called per  Dr  Robinson  MD 

## 2022-07-06 NOTE — ED Provider Notes (Signed)
Blood pressures have stabilized.  Maps consistently over 65.  CT of the abdomen pelvis, chest shows no acute traumatic injuries.  [facility] called for transport.   Pilar Jarvis, MD 07/06/22 [phone]

## 2022-07-06 NOTE — ED Triage Notes (Signed)
Mechanical fall, hit face on curb and injured RIGHT shoulder

## 2022-07-06 NOTE — ED Notes (Signed)
[facility] Charge RN called and made aware that patient is being picked up for transport at this time

## 2022-07-06 NOTE — ED Notes (Signed)
Patient and family member updated on plan of care.  All questions answered.  Patient reports pain is currently under control

## 2022-07-06 NOTE — ED Provider Notes (Signed)
[facility] Provider Note    Event Date/Time   First MD Initiated Contact with Patient 07/06/22 1333     (approximate)   History   Fall (Mechanical fall, hit face on curb and injured RIGHT shoulder)   HPI  Jordan [name] is a 77 y.o. male presents to the ER for evaluation of acute right arm shoulder pain that occurred after mechanical fall while walking in the parking lot tripped over a parking lot divider.  Fell flat on his face.  Denies any other associated pain.  Not any anticoagulation.     Physical Exam   Triage Vital Signs: ED Triage Vitals  Enc Vitals Group     BP 07/06/22 1301 (!) 123/54     Pulse Rate 07/06/22 1301 78     Resp 07/06/22 1301 19     Temp 07/06/22 1301 98.3 F (36.8 C)     Temp Source 07/06/22 1301 Oral     SpO2 07/06/22 1301 95 %     Weight 07/06/22 1256 216 lb (98 kg)     Height 07/06/22 1256 6' (1.829 m)     Head Circumference --      Peak Flow --      Pain Score 07/06/22 1256 10     Pain Loc --      Pain Edu? --      Excl. in GC? --     Most recent vital signs: Vitals:   07/06/22 1522 07/06/22 1525  BP: (!) 92/50   Pulse: (!) 59   Resp:    Temp:    SpO2:  91%     Constitutional: Alert  Eyes: Conjunctivae are normal.  Head: Extensive contusion of forehead as well as nose with raccoon eyes.  No proptosis. Nose: No congestion/rhinnorhea. Mouth/Throat: Mucous membranes are moist.   Neck: Painless ROM.  Cardiovascular:   Good peripheral circulation. Respiratory: Normal respiratory effort.  No retractions.  Gastrointestinal: Soft and nontender.  Musculoskeletal: Deformity to the right upper arm no laceration or puncture wound.  Neurovascular intact distally.  Compartments soft. Neurologic:  MAE spontaneously. No gross focal neurologic deficits are appreciated.  Skin:  Skin is warm, dry and intact. No rash noted. Psychiatric: Mood and affect are normal. Speech and behavior are normal.    ED Results /  Procedures / Treatments   Labs (all labs ordered are listed, but only abnormal results are displayed) Labs Reviewed  CBC WITH DIFFERENTIAL/PLATELET - Abnormal; Notable for the following components:      Result Value   RBC 4.05 (*)    Hemoglobin 12.6 (*)    HCT 38.2 (*)    Platelets 139 (*)    Neutro Abs 8.8 (*)    All other components within normal limits  COMPREHENSIVE METABOLIC PANEL - Abnormal; Notable for the following components:   Sodium 133 (*)    CO2 19 (*)    Glucose, Bld 114 (*)    Calcium 7.8 (*)    Total Protein 6.0 (*)    All other components within normal limits  TYPE AND SCREEN  TROPONIN I (HIGH SENSITIVITY)     EKG     RADIOLOGY Please see ED Course for my review and interpretation.  I personally reviewed all radiographic images ordered to evaluate for the above acute complaints and reviewed radiology reports and findings.  These findings were personally discussed with the patient.  Please see medical record for radiology report.    PROCEDURES:  Critical Care  performed: No  Procedures   MEDICATIONS ORDERED IN ED: Medications  morphine (PF) 4 MG/ML injection 4 mg (4 mg Intravenous Given 07/06/22 1414)  fentaNYL (SUBLIMAZE) injection 50 mcg (has no administration in time range)  ondansetron (ZOFRAN) injection 4 mg (4 mg Intravenous Given 07/06/22 1412)  0.9 %  sodium chloride infusion ( Intravenous New Bag/Given 07/06/22 1522)  iohexol (OMNIPAQUE) 300 MG/ML solution 100 mL (100 mLs Intravenous Contrast Given 07/06/22 1536)     IMPRESSION / MDM / ASSESSMENT AND PLAN / ED COURSE  I reviewed the triage vital signs and the nursing notes.                              Differential diagnosis includes, but is not limited to, fracture, contusion, dislocation, SDH, IPH  Patient presenting to the ER for evaluation of symptoms as described above.  Based on symptoms, risk factors and considered above differential, this presenting complaint could reflect a  potentially life-threatening illness therefore the patient will be placed on continuous pulse oximetry and telemetry for monitoring.  Laboratory evaluation will be sent to evaluate for the above complaints.      Clinical Course as of 07/06/22 1548  Thu Jul 06, 2022  1342 X-ray my review and interpretation shows evidence of comminuted displaced spiral fracture of the humerus. [PR]  1400 I reviewed humerus fracture in consultation with Dr. Abdomen of orthopedics here who has recommended consultation with Ortho trauma in [place]. [PR]  1429 CT imaging for atrial fracture concerning for LeFort II. [PR]  1429 Family has requested care at [facility]. [PR]  1517 Patient's blood pressure did drop slightly after morphine.  Continues to mentate well.  Will give fluid.  Aspirin is here for patient but with downtrending blood pressure will order CT chest abdomen pelvis to ensure the patient does not have any other associated injury that would make him unstable for transport.  Giving fluid resuscitation.  Will sign out to oncoming physician. [PR]    Clinical Course User Index [PR] Willy Eddy, MD     FINAL CLINICAL IMPRESSION(S) / ED DIAGNOSES   Final diagnoses:  Closed displaced spiral fracture of shaft of right humerus, initial encounter  Closed Jerry Caras II fracture, initial encounter [facility])     Rx / DC Orders   ED Discharge Orders     None        Note:  This document was prepared using Dragon voice recognition software and may include unintentional dictation errors.    Willy Eddy, MD 07/06/22 [id number]

## 2022-07-06 NOTE — ED Notes (Signed)
Patient transported via [facility] to [facility] at this time

## 2022-07-06 NOTE — ED Notes (Signed)
Arnel to [facility] images to [facility] 

## 2022-11-16 ENCOUNTER — Ambulatory Visit
Admission: EM | Admit: 2022-11-16 | Discharge: 2022-11-16 | Disposition: A | Discharge status: Home / Self Care | Payer: Medicare Other

## 2022-11-16 DIAGNOSIS — Z5189 Encounter for other specified aftercare: Secondary | ICD-10-CM

## 2022-11-16 NOTE — ED Triage Notes (Signed)
Pt c/o right arm incision pus and drainage.  Pt had a procedure done 3 weeks ago and noticed pus along his right arm

## 2022-11-16 NOTE — Discharge Instructions (Addendum)
Please follow-up with the surgeon that operated on your right arm

## 2022-11-16 NOTE — ED Provider Notes (Signed)
[facility]    CSN: [id number] Arrival date & time: 11/16/22  1621      History   Chief Complaint Chief Complaint  Patient presents with   Wound Check         HPI Jordan [name] is a 77 y.o. male.   77 year old male patient, Jordan [name], presents to urgent care for evaluation of right arm incision post orthopedic surgery repair 3 weeks prior due to fractured humerus.  Patient has not followed up with his orthopedist, patient noticed pus and blood drainage from right arm today, denies re-injury. Pt has bandage in place.  Patient and wife both state they have tried to get in touch with the orthopedist and are unable to reach anyone.  Pt recently had blood clot in right arm as well.  The history is provided by the patient and the spouse. No language interpreter was used.    Past Medical History:  Diagnosis Date   Anemia 2018   Anxiety    COPD (chronic obstructive pulmonary disease) (HCC)    Depression    GERD (gastroesophageal reflux disease)    History of kidney stones    Hypertension    Kidney stone    MS (multiple sclerosis) (HCC)    Restless leg     Patient Active Problem List   Diagnosis Date Noted   Visit for wound check 11/16/2022   Substernal chest pain 09/02/2018   Hypokalemia 09/17/2017   Traumatic complete tear of left rotator cuff 07/10/2017   Restless leg 05/10/2017   Acute exacerbation of chronic obstructive pulmonary disease (COPD) (HCC) 03/21/2017   CAP (community acquired pneumonia) 03/20/2017   Benign prostatic hyperplasia without lower urinary tract symptoms 01/12/2016   Erectile dysfunction 01/12/2016   Bronchiectasis without complication (HCC) 05/20/2014   Abnormal echocardiogram 04/16/2014   Abnormal nuclear stress test 04/16/2014   Anxiety 10/02/2012   History of adenomatous polyp of colon 10/02/2012   Chronic laryngitis 03/05/2012   Liver lesion 03/05/2012   Thyroid nodule 03/05/2012   Encounter for long-term (current) use of  other medications 01/12/2012   History of migraine 01/12/2012   History of nephrolithiasis 01/12/2012   Vitamin D deficiency 01/12/2012   Asthma 08/22/2011   Daytime somnolence 08/22/2011   Essential hypertension 08/22/2011   Gastroesophageal reflux disease without esophagitis 08/22/2011   Lipid disorder 08/22/2011   Mood disorder (HCC) 08/22/2011   Multiple sclerosis (HCC) 08/22/2011   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   Pseudophakia of both eyes 07/28/2011   Nuclear sclerotic cataract of right eye 07/14/2011    Past Surgical History:  Procedure Laterality Date   CYSTOSCOPY W/ RETROGRADES Left 05/06/2019   Procedure: CYSTOSCOPY WITH RETROGRADE PYELOGRAM;  Surgeon: Riki Altes, MD;  Location: [facility];  Service: Urology;  Laterality: Left;   CYSTOSCOPY/URETEROSCOPY/HOLMIUM LASER/STENT PLACEMENT Left 05/06/2019   Procedure: CYSTOSCOPY/URETEROSCOPY/HOLMIUM LASER/STENT PLACEMENT;  Surgeon: Riki Altes, MD;  Location: [facility];  Service: Urology;  Laterality: Left;       Home Medications    Prior to Admission medications   Medication Sig Start Date End Date Taking? Authorizing Provider  albuterol (PROVENTIL) (2.5 MG/3ML) 0.083% nebulizer solution Take 2.5 mg by nebulization every 6 (six) hours as needed for wheezing or shortness of breath.  06/29/18   [provider]  ALPRAZolam Prudy Feeler) 0.25 MG tablet Take 0.25 mg by mouth 3 (three) times daily.  06/04/09   [provider]  amLODipine (NORVASC) 10 MG tablet Take 10 mg by mouth daily. 05/13/19  [provider]  atorvastatin (LIPITOR) 80 MG tablet Take 80 mg by mouth daily.  09/04/18   [provider]  buPROPion (WELLBUTRIN SR) 100 MG 12 hr tablet Take 100 mg by mouth daily. At night    [provider]  buPROPion (WELLBUTRIN SR) 150 MG 12 hr tablet Take 150 mg by mouth every morning. 05/08/19   [provider]  carbidopa-levodopa (SINEMET IR) 25-100 MG tablet Take 1 tablet by  mouth 3 (three) times daily. 08/13/20   [provider]  Cholecalciferol (D2000 ULTRA STRENGTH) 50 MCG (2000 UT) CAPS Take 4,000 Units by mouth daily.     [provider]  esomeprazole (NEXIUM) 40 MG capsule Take 40 mg by mouth 2 (two) times daily before a meal.  01/12/12   [provider]  fluticasone (FLONASE) 50 MCG/ACT nasal spray Place 1 spray into both nostrils daily as needed for allergies.     [provider]  Fluticasone-Umeclidin-Vilant 100-62.5-25 MCG/INH AEPB Inhale 1 puff into the lungs daily.  09/26/16   [provider]  gabapentin (NEURONTIN) 600 MG tablet Take 1,800 mg by mouth at bedtime.  06/04/09   [provider]  losartan (COZAAR) 100 MG tablet Take 100 mg by mouth daily.  11/04/18   [provider]  meclizine (ANTIVERT) 25 MG tablet Take 25 mg by mouth 3 (three) times daily as needed for dizziness.  06/04/09   [provider]  Multiple Vitamin (MULTI-VITAMIN) tablet Take 1 tablet by mouth daily.     [provider]  oxybutynin (DITROPAN) 5 MG tablet 1 tab tid prn frequency,urgency, bladder spasm 05/06/19   Stoioff, Verna Czech, MD  phenazopyridine (PYRIDIUM) 200 MG tablet Take 1 tablet (200 mg total) by mouth 3 (three) times daily as needed for pain. 10/04/21   Sudie Grumbling, NP  potassium chloride SA (KLOR-CON) 20 MEQ tablet Take 60 mEq by mouth 2 (two) times daily.  04/29/12   [provider]  sertraline (ZOLOFT) 100 MG tablet Take 100 mg by mouth daily.  06/04/09   [provider]  spironolactone (ALDACTONE) 25 MG tablet Take 25 mg by mouth daily.  08/14/18 08/14/19  [provider]  tadalafil (CIALIS) 5 MG tablet Take 5 mg by mouth daily. 08/28/20   [provider]  topiramate (TOPAMAX) 25 MG tablet Take 25 mg by mouth 2 (two) times daily. 09/01/20   [provider]  zolpidem (AMBIEN CR) 12.5 MG CR tablet Take 12.5 mg by mouth at bedtime as needed for sleep.      [provider]    Family History No family history on file.  Social History Social History   Tobacco Use   Smoking status: Former   Smokeless tobacco: Never  Advertising account planner   Vaping status: Never Used  Substance Use Topics   Alcohol use: Yes    Comment: beer with dinner   Drug use: Never     Allergies   Latex, Penicillins, Primidone, and Doxycycline   Review of Systems Review of Systems  Constitutional:  Negative for fever.  Skin:  Positive for color change and wound.  All other systems reviewed and are negative.    Physical Exam Triage Vital Signs ED Triage Vitals  Encounter Vitals Group     BP 11/16/22 1639 (!) 157/99     Systolic BP Percentile --      Diastolic BP Percentile --      Pulse Rate 11/16/22 1639 67     Resp --  Temp 11/16/22 1639 98 F (36.7 C)     Temp Source 11/16/22 1639 Oral     SpO2 11/16/22 1639 97 %     Weight 11/16/22 1637 194 lb (88 kg)     Height 11/16/22 1637 6' (1.829 m)     Head Circumference --      Peak Flow --      Pain Score 11/16/22 1637 0     Pain Loc --      Pain Education --      Exclude from Growth Chart --    No data found.  Updated Vital Signs BP (!) 157/99 (BP Location: Left Arm)   Pulse 67   Temp 98 F (36.7 C) (Oral)   Ht 6' (1.829 m)   Wt 194 lb (88 kg)   SpO2 97%   BMI 26.31 kg/m   Visual Acuity Right Eye Distance:   Left Eye Distance:   Bilateral Distance:    Right Eye Near:   Left Eye Near:    Bilateral Near:     Physical Exam Vitals and nursing note reviewed.  Cardiovascular:     Pulses:          Radial pulses are 2+ on the right side.  Skin:    General: Skin is warm.     Capillary Refill: Capillary refill takes less than 2 seconds.     Findings: Bruising, ecchymosis and wound present.     Comments: Purulent drainage and sanginous drainage from surgical site/incision(right humerus). +swelling,bruising noted  Neurological:     Mental Status: He is alert and oriented to  person, place, and time.     GCS: GCS eye subscore is 4. GCS verbal subscore is 5. GCS motor subscore is 6.  Psychiatric:        Attention and Perception: Attention normal.        Mood and Affect: Mood normal.        Speech: Speech normal.        Behavior: Behavior normal. Behavior is cooperative.      UC Treatments / Results  Labs (all labs ordered are listed, but only abnormal results are displayed) Labs Reviewed - No data to display  EKG   Radiology No results found.  Procedures Procedures (including critical care time)  Medications Ordered in UC Medications - No data to display  Initial Impression / Assessment and Plan / UC Course  I have reviewed the triage vital signs and the nursing notes.  Pertinent labs & imaging results that were available during my care of the patient were reviewed by me and considered in my medical decision making (see chart for details).  Clinical Course as of 11/16/22 1815  Thu Nov 16, 2022  1644 Dressing changed, pt tolerated well, discussed with pt and wife need to follow up with orthopedic surgeon.  [JD]    Clinical Course User Index [JD] Nigel Ericsson, Para March, NP   Patient and wife both verbalized understanding this provider, will follow-up with the orthopedic surgeon.  Ddx: Postop complication, surgical site infection Final Clinical Impressions(s) / UC Diagnoses   Final diagnoses:  Visit for wound check     Discharge Instructions      Please follow-up with the surgeon that operated on your right arm    ED Prescriptions   None    PDMP not reviewed this encounter.   Clancy Gourd, NP 11/16/22 1816

## 2023-04-08 ENCOUNTER — Inpatient Hospital Stay
Admission: EM | Admit: 2023-04-08 | Discharge: 2023-04-12 | DRG: 291 | Disposition: A | Discharge status: Home / Self Care | Attending: Student | Admitting: Student

## 2023-04-08 ENCOUNTER — Emergency Department

## 2023-04-08 ENCOUNTER — Other Ambulatory Visit: Payer: Self-pay

## 2023-04-08 DIAGNOSIS — I5023 Acute on chronic systolic (congestive) heart failure: Secondary | ICD-10-CM | POA: Diagnosis not present

## 2023-04-08 DIAGNOSIS — I4819 Other persistent atrial fibrillation: Secondary | ICD-10-CM | POA: Diagnosis present

## 2023-04-08 DIAGNOSIS — G20A1 Parkinson's disease without dyskinesia, without mention of fluctuations: Secondary | ICD-10-CM | POA: Diagnosis present

## 2023-04-08 DIAGNOSIS — J9600 Acute respiratory failure, unspecified whether with hypoxia or hypercapnia: Secondary | ICD-10-CM | POA: Diagnosis present

## 2023-04-08 DIAGNOSIS — I2489 Other forms of acute ischemic heart disease: Secondary | ICD-10-CM | POA: Diagnosis present

## 2023-04-08 DIAGNOSIS — Z833 Family history of diabetes mellitus: Secondary | ICD-10-CM

## 2023-04-08 DIAGNOSIS — I5043 Acute on chronic combined systolic (congestive) and diastolic (congestive) heart failure: Secondary | ICD-10-CM | POA: Diagnosis present

## 2023-04-08 DIAGNOSIS — Z8249 Family history of ischemic heart disease and other diseases of the circulatory system: Secondary | ICD-10-CM

## 2023-04-08 DIAGNOSIS — F32A Depression, unspecified: Secondary | ICD-10-CM | POA: Diagnosis present

## 2023-04-08 DIAGNOSIS — I2510 Atherosclerotic heart disease of native coronary artery without angina pectoris: Secondary | ICD-10-CM | POA: Diagnosis present

## 2023-04-08 DIAGNOSIS — I4510 Unspecified right bundle-branch block: Secondary | ICD-10-CM | POA: Diagnosis present

## 2023-04-08 DIAGNOSIS — G2581 Restless legs syndrome: Secondary | ICD-10-CM | POA: Diagnosis present

## 2023-04-08 DIAGNOSIS — L89312 Pressure ulcer of right buttock, stage 2: Secondary | ICD-10-CM | POA: Diagnosis present

## 2023-04-08 DIAGNOSIS — T502X5A Adverse effect of carbonic-anhydrase inhibitors, benzothiadiazides and other diuretics, initial encounter: Secondary | ICD-10-CM | POA: Diagnosis present

## 2023-04-08 DIAGNOSIS — I10 Essential (primary) hypertension: Secondary | ICD-10-CM | POA: Diagnosis not present

## 2023-04-08 DIAGNOSIS — J449 Chronic obstructive pulmonary disease, unspecified: Secondary | ICD-10-CM | POA: Diagnosis present

## 2023-04-08 DIAGNOSIS — F418 Other specified anxiety disorders: Secondary | ICD-10-CM | POA: Diagnosis not present

## 2023-04-08 DIAGNOSIS — G35 Multiple sclerosis: Secondary | ICD-10-CM | POA: Diagnosis present

## 2023-04-08 DIAGNOSIS — Z91040 Latex allergy status: Secondary | ICD-10-CM

## 2023-04-08 DIAGNOSIS — J810 Acute pulmonary edema: Principal | ICD-10-CM

## 2023-04-08 DIAGNOSIS — I5A Non-ischemic myocardial injury (non-traumatic): Secondary | ICD-10-CM | POA: Diagnosis present

## 2023-04-08 DIAGNOSIS — J189 Pneumonia, unspecified organism: Secondary | ICD-10-CM | POA: Diagnosis present

## 2023-04-08 DIAGNOSIS — Z79899 Other long term (current) drug therapy: Secondary | ICD-10-CM

## 2023-04-08 DIAGNOSIS — Z66 Do not resuscitate: Secondary | ICD-10-CM | POA: Diagnosis present

## 2023-04-08 DIAGNOSIS — E785 Hyperlipidemia, unspecified: Secondary | ICD-10-CM

## 2023-04-08 DIAGNOSIS — I428 Other cardiomyopathies: Secondary | ICD-10-CM | POA: Diagnosis present

## 2023-04-08 DIAGNOSIS — I110 Hypertensive heart disease with heart failure: Secondary | ICD-10-CM | POA: Diagnosis present

## 2023-04-08 DIAGNOSIS — R7989 Other specified abnormal findings of blood chemistry: Secondary | ICD-10-CM | POA: Diagnosis not present

## 2023-04-08 DIAGNOSIS — I482 Chronic atrial fibrillation, unspecified: Secondary | ICD-10-CM | POA: Diagnosis not present

## 2023-04-08 DIAGNOSIS — N400 Enlarged prostate without lower urinary tract symptoms: Secondary | ICD-10-CM | POA: Diagnosis present

## 2023-04-08 DIAGNOSIS — Z880 Allergy status to penicillin: Secondary | ICD-10-CM

## 2023-04-08 DIAGNOSIS — E876 Hypokalemia: Secondary | ICD-10-CM | POA: Diagnosis present

## 2023-04-08 DIAGNOSIS — R000 Tachycardia, unspecified: Secondary | ICD-10-CM | POA: Diagnosis not present

## 2023-04-08 DIAGNOSIS — J440 Chronic obstructive pulmonary disease with acute lower respiratory infection: Secondary | ICD-10-CM | POA: Diagnosis present

## 2023-04-08 DIAGNOSIS — Z6823 Body mass index (BMI) 23.0-23.9, adult: Secondary | ICD-10-CM

## 2023-04-08 DIAGNOSIS — J470 Bronchiectasis with acute lower respiratory infection: Secondary | ICD-10-CM | POA: Diagnosis present

## 2023-04-08 DIAGNOSIS — I509 Heart failure, unspecified: Secondary | ICD-10-CM

## 2023-04-08 DIAGNOSIS — Z881 Allergy status to other antibiotic agents status: Secondary | ICD-10-CM

## 2023-04-08 DIAGNOSIS — Z7901 Long term (current) use of anticoagulants: Secondary | ICD-10-CM

## 2023-04-08 DIAGNOSIS — D696 Thrombocytopenia, unspecified: Secondary | ICD-10-CM

## 2023-04-08 DIAGNOSIS — E663 Overweight: Secondary | ICD-10-CM

## 2023-04-08 DIAGNOSIS — J479 Bronchiectasis, uncomplicated: Secondary | ICD-10-CM

## 2023-04-08 DIAGNOSIS — Z87442 Personal history of urinary calculi: Secondary | ICD-10-CM

## 2023-04-08 DIAGNOSIS — Z87892 Personal history of anaphylaxis: Secondary | ICD-10-CM

## 2023-04-08 DIAGNOSIS — Z87891 Personal history of nicotine dependence: Secondary | ICD-10-CM

## 2023-04-08 DIAGNOSIS — F419 Anxiety disorder, unspecified: Secondary | ICD-10-CM | POA: Diagnosis present

## 2023-04-08 DIAGNOSIS — K219 Gastro-esophageal reflux disease without esophagitis: Secondary | ICD-10-CM | POA: Diagnosis present

## 2023-04-08 DIAGNOSIS — I4891 Unspecified atrial fibrillation: Secondary | ICD-10-CM | POA: Diagnosis not present

## 2023-04-08 DIAGNOSIS — Z888 Allergy status to other drugs, medicaments and biological substances status: Secondary | ICD-10-CM

## 2023-04-08 LAB — CBC WITH DIFFERENTIAL/PLATELET
Abs Immature Granulocytes: 0.03 10*3/uL (ref 0.00–0.07)
Basophils Absolute: 0 10*3/uL (ref 0.0–0.1)
Basophils Relative: 0 %
Eosinophils Absolute: 0 10*3/uL (ref 0.0–0.5)
Eosinophils Relative: 0 %
HCT: 41.2 % (ref 39.0–52.0)
Hemoglobin: 13.1 g/dL (ref 13.0–17.0)
Immature Granulocytes: 0 %
Lymphocytes Relative: 26 %
Lymphs Abs: 2.3 10*3/uL (ref 0.7–4.0)
MCH: 30.1 pg (ref 26.0–34.0)
MCHC: 31.8 g/dL (ref 30.0–36.0)
MCV: 94.7 fL (ref 80.0–100.0)
Monocytes Absolute: 0.4 10*3/uL (ref 0.1–1.0)
Monocytes Relative: 4 %
Neutro Abs: 6.3 10*3/uL (ref 1.7–7.7)
Neutrophils Relative %: 70 %
Platelets: 123 10*3/uL — ABNORMAL LOW (ref 150–400)
RBC: 4.35 MIL/uL (ref 4.22–5.81)
RDW: 16.5 % — ABNORMAL HIGH (ref 11.5–15.5)
WBC: 9 10*3/uL (ref 4.0–10.5)
nRBC: 0 % (ref 0.0–0.2)

## 2023-04-08 LAB — BASIC METABOLIC PANEL WITH GFR
Anion gap: 11 (ref 5–15)
BUN: 17 mg/dL (ref 8–23)
CO2: 16 mmol/L — ABNORMAL LOW (ref 22–32)
Calcium: 8.4 mg/dL — ABNORMAL LOW (ref 8.9–10.3)
Chloride: 111 mmol/L (ref 98–111)
Creatinine, Ser: 1.19 mg/dL (ref 0.61–1.24)
GFR, Estimated: >60 mL/min (ref >60)
Glucose, Bld: 262 mg/dL — ABNORMAL HIGH (ref 70–99)
Potassium: 4.6 mmol/L (ref 3.5–5.1)
Sodium: 138 mmol/L (ref 135–145)

## 2023-04-08 LAB — TROPONIN I (HIGH SENSITIVITY): Troponin I (High Sensitivity): 35 ng/L — ABNORMAL HIGH (ref <18)

## 2023-04-08 LAB — BRAIN NATRIURETIC PEPTIDE: B Natriuretic Peptide: 4363.7 pg/mL — ABNORMAL HIGH (ref 0.0–100.0)

## 2023-04-08 MED ORDER — ACETAMINOPHEN 325 MG PO TABS: 650.0000 mg | ORAL_TABLET | Freq: Four times a day (QID) | ORAL | Status: DC | PRN

## 2023-04-08 MED ORDER — HYDRALAZINE HCL 20 MG/ML IJ SOLN: 5.0000 mg | INTRAMUSCULAR | Status: DC | PRN

## 2023-04-08 MED ORDER — NITROGLYCERIN 2 % TD OINT
0.5000 [in_us] | TOPICAL_OINTMENT | Freq: Once | TRANSDERMAL | Status: DC
  Filled 2023-04-08: qty 1

## 2023-04-08 MED ORDER — DM-GUAIFENESIN ER 30-600 MG PO TB12: 1.0000 | ORAL_TABLET | Freq: Two times a day (BID) | ORAL | Status: DC | PRN

## 2023-04-08 MED ORDER — ONDANSETRON HCL 4 MG/2ML IJ SOLN: 4.0000 mg | Freq: Three times a day (TID) | INTRAMUSCULAR | Status: DC | PRN

## 2023-04-08 MED ORDER — ALBUTEROL SULFATE HFA 108 (90 BASE) MCG/ACT IN AERS: 2.0000 | INHALATION_SPRAY | RESPIRATORY_TRACT | Status: DC | PRN

## 2023-04-08 MED ORDER — NITROGLYCERIN IN D5W 200-5 MCG/ML-% IV SOLN
0.0000 ug/min | INTRAVENOUS | Status: DC
  Administered 2023-04-08: 40 ug/min via INTRAVENOUS
  Filled 2023-04-08: qty 250

## 2023-04-08 MED ORDER — ALBUTEROL SULFATE (2.5 MG/3ML) 0.083% IN NEBU
2.5000 mg | INHALATION_SOLUTION | RESPIRATORY_TRACT | Status: DC | PRN
  Administered 2023-04-12: 2.5 mg via RESPIRATORY_TRACT
  Filled 2023-04-08: qty 3

## 2023-04-08 MED ORDER — FUROSEMIDE 10 MG/ML IJ SOLN
40.0000 mg | Freq: Once | INTRAMUSCULAR | Status: AC
  Administered 2023-04-08: 40 mg via INTRAVENOUS
  Filled 2023-04-08: qty 4

## 2023-04-08 NOTE — ED Notes (Signed)
 Family at bedside.

## 2023-04-08 NOTE — ED Triage Notes (Signed)
 PER EMS: Pt with history of COPD is here for sudden onset of shortness of breath today while at home associated with intermittent dry cough. He arrives diaphoretic, labored breathing and on cpap which was initiated by EMS en route. Denies fever, chills, congestion, chest pain, abdominal pain, nausea, vomiting. He also presents with swelling to his lower extremities recently which is new. Denies prior history of heart failure.

## 2023-04-08 NOTE — ED Notes (Signed)
 X-ray at bedside

## 2023-04-08 NOTE — H&P (Signed)
 History and Physical    Jordan [name] WUJ:[id number] DOB: [birth year] DOA: 04/08/2023  Referring MD/NP/PA:   PCP: Damaris Schooner, DO   Patient coming from:  The patient is coming from home.     Chief Complaint: SOB and leg edema  HPI: Jordan [name] is a 78 y.o. male with medical history significant of sCHF with EF30-35%,  A fib on Eliquis, HTN, HLD, COPD, depression with anxiety, BPH, Parkinson's disease, bronchiectasis, kidney stone, multiple sclerosis, thrombocytopenia, who presents with SOB and leg edema.  Patient states that he has shortness breath in the past several days, which has been progressively worsening.  Patient does not have cough and chest pain.  No fever or chills.  No nausea, vomiting, diarrhea or abdominal pain.  No symptoms of UTI.  Patient has worsening bilateral leg edema recently.  Patient is taking Eliquis consistently.  Per ED physician, patient does not have oxygen desaturation, but BiPAP is started in ED for acute CHF/pulmonary edema.  Nitroglycerin drip is started in ED.  Data reviewed independently and ED Course: pt was found to have BNP 4363, GFR> 60, troponin 35, temperature normal, blood pressure 181/117, heart rate 114, 73, RR 38, 18.  Chest x-ray showed cardiomegaly, vascular congestion and small left pleural effusion.  Patient is admitted to PCU as inpatient.   EKG: I have personally reviewed.  A-fib, QTc 487, poor R wave progression, heart rate 108.   Review of Systems:   General: no fevers, chills, no body weight gain, has fatigue HEENT: no blurry vision, hearing changes or sore throat Respiratory: has dyspnea, no coughing, wheezing CV: no chest pain, no palpitations GI: no nausea, vomiting, abdominal pain, diarrhea, constipation GU: no dysuria, burning on urination, increased urinary frequency, hematuria  Ext: has leg edema Neuro: no unilateral weakness, numbness, or tingling, no vision change or hearing loss Skin: no rash, no skin tear. MSK: No  muscle spasm, no deformity, no limitation of range of movement in spin Heme: No easy bruising.  Travel history: No recent long distant travel.   Allergy:  Allergies  Allergen Reactions   Latex Anaphylaxis   Penicillins Hives and Rash   Primidone Shortness Of Breath   Doxycycline Other (See Comments)    Abdominal cramping     Past Medical History:  Diagnosis Date   Anemia 2018   Anxiety    COPD (chronic obstructive pulmonary disease) (HCC)    Depression    GERD (gastroesophageal reflux disease)    History of kidney stones    Hypertension    Kidney stone    MS (multiple sclerosis) (HCC)    Restless leg     Past Surgical History:  Procedure Laterality Date   CYSTOSCOPY W/ RETROGRADES Left 05/06/2019   Procedure: CYSTOSCOPY WITH RETROGRADE PYELOGRAM;  Surgeon: Riki Altes, MD;  Location: [facility];  Service: Urology;  Laterality: Left;   CYSTOSCOPY/URETEROSCOPY/HOLMIUM LASER/STENT PLACEMENT Left 05/06/2019   Procedure: CYSTOSCOPY/URETEROSCOPY/HOLMIUM LASER/STENT PLACEMENT;  Surgeon: Riki Altes, MD;  Location: [facility];  Service: Urology;  Laterality: Left;    Social History:  reports that he has quit smoking. He has never used smokeless tobacco. He reports current alcohol use. He reports that he does not use drugs.  Family History:  Family History  Problem Relation Age of Onset   Heart disease Mother    Diabetes Father    Diabetes Brother      Prior to Admission medications   Medication Sig Start Date End Date Taking? Authorizing Provider  albuterol (PROVENTIL) (2.5 MG/3ML) 0.083% nebulizer solution Take 2.5 mg by nebulization every 6 (six) hours as needed for wheezing or shortness of breath.  06/29/18   [provider]  ALPRAZolam Prudy Feeler) 0.25 MG tablet Take 0.25 mg by mouth 3 (three) times daily.  06/04/09   [provider]  amLODipine (NORVASC) 10 MG tablet Take 10 mg by mouth daily. 05/13/19   [provider]  atorvastatin (LIPITOR) 80  MG tablet Take 80 mg by mouth daily.  09/04/18   [provider]  buPROPion (WELLBUTRIN SR) 100 MG 12 hr tablet Take 100 mg by mouth daily. At night    [provider]  buPROPion (WELLBUTRIN SR) 150 MG 12 hr tablet Take 150 mg by mouth every morning. 05/08/19   [provider]  carbidopa-levodopa (SINEMET IR) 25-100 MG tablet Take 1 tablet by mouth 3 (three) times daily. 08/13/20   [provider]  Cholecalciferol (D2000 ULTRA STRENGTH) 50 MCG (2000 UT) CAPS Take 4,000 Units by mouth daily.     [provider]  esomeprazole (NEXIUM) 40 MG capsule Take 40 mg by mouth 2 (two) times daily before a meal.  01/12/12   [provider]  fluticasone (FLONASE) 50 MCG/ACT nasal spray Place 1 spray into both nostrils daily as needed for allergies.     [provider]  Fluticasone-Umeclidin-Vilant 100-62.5-25 MCG/INH AEPB Inhale 1 puff into the lungs daily.  09/26/16   [provider]  gabapentin (NEURONTIN) 600 MG tablet Take 1,800 mg by mouth at bedtime.  06/04/09   [provider]  losartan (COZAAR) 100 MG tablet Take 100 mg by mouth daily.  11/04/18   [provider]  meclizine (ANTIVERT) 25 MG tablet Take 25 mg by mouth 3 (three) times daily as needed for dizziness.  06/04/09   [provider]  Multiple Vitamin (MULTI-VITAMIN) tablet Take 1 tablet by mouth daily.     [provider]  oxybutynin (DITROPAN) 5 MG tablet 1 tab tid prn frequency,urgency, bladder spasm 05/06/19   Stoioff, Verna Czech, MD  phenazopyridine (PYRIDIUM) 200 MG tablet Take 1 tablet (200 mg total) by mouth 3 (three) times daily as needed for pain. 10/04/21   Sudie Grumbling, NP  potassium chloride SA (KLOR-CON) 20 MEQ tablet Take 60 mEq by mouth 2 (two) times daily.  04/29/12   [provider]  sertraline (ZOLOFT) 100 MG tablet Take 100 mg by mouth daily.  06/04/09   [provider]  spironolactone (ALDACTONE) 25 MG tablet Take 25 mg  by mouth daily.  08/14/18 08/14/19  [provider]  tadalafil (CIALIS) 5 MG tablet Take 5 mg by mouth daily. 08/28/20   [provider]  topiramate (TOPAMAX) 25 MG tablet Take 25 mg by mouth 2 (two) times daily. 09/01/20   [provider]  zolpidem (AMBIEN CR) 12.5 MG CR tablet Take 12.5 mg by mouth at bedtime as needed for sleep.     [provider]    Physical Exam: Vitals:   04/08/23 2300 04/08/23 2315 04/09/23 0000 04/09/23 0030  BP: 132/83  122/76 (!) 140/92  Pulse: 98 88 99 95  Resp: (!) 22 19  19   Temp:      TempSrc:      SpO2: 95% 95% 94% 95%  Weight:      Height:       General: Not in acute distress HEENT:       Eyes: PERRL, EOMI, no jaundice  ENT: No discharge from the ears and nose, no pharynx injection, no tonsillar enlargement.        Neck: positive JVD, no bruit, no mass felt. Heme: No neck lymph node enlargement. Cardiac: S1/S2, irregularly irregular rhythm, No murmurs, No gallops or rubs. Respiratory: Has crackles bilaterally. GI: Soft, nondistended, nontender, no rebound pain, no organomegaly, BS present. GU: No hematuria Ext: 2+ pitting leg edema bilaterally. 1+DP/PT pulse bilaterally. Musculoskeletal: No joint deformities, No joint redness or warmth, no limitation of ROM in spin. Skin: No rashes.  Neuro: Alert, oriented X3, cranial nerves II-XII grossly intact, moves all extremities normally.  Psych: Patient is not psychotic, no suicidal or hemocidal ideation.  Labs on Admission: I have personally reviewed following labs and imaging studies  CBC: Recent Labs  Lab 04/08/23 2124  WBC 9.0  NEUTROABS 6.3  HGB 13.1  HCT 41.2  MCV 94.7  PLT 123*   Basic Metabolic Panel: Recent Labs  Lab 04/08/23 2124  NA 138  K 4.6  CL 111  CO2 16*  GLUCOSE 262*  BUN 17  CREATININE 1.19  CALCIUM 8.4*   GFR: Estimated Creatinine Clearance: 58 mL/min (by C-G formula based on SCr of 1.19 mg/dL). Liver Function Tests: No  results for input(s): "AST", "ALT", "ALKPHOS", "BILITOT", "PROT", "ALBUMIN" in the last 168 hours. No results for input(s): "LIPASE", "AMYLASE" in the last 168 hours. No results for input(s): "AMMONIA" in the last 168 hours. Coagulation Profile: No results for input(s): "INR", "PROTIME" in the last 168 hours. Cardiac Enzymes: No results for input(s): "CKTOTAL", "CKMB", "CKMBINDEX", "TROPONINI" in the last 168 hours. BNP (last 3 results) No results for input(s): "PROBNP" in the last 8760 hours. HbA1C: No results for input(s): "HGBA1C" in the last 72 hours. CBG: No results for input(s): "GLUCAP" in the last 168 hours. Lipid Profile: No results for input(s): "CHOL", "HDL", "LDLCALC", "TRIG", "CHOLHDL", "LDLDIRECT" in the last 72 hours. Thyroid Function Tests: No results for input(s): "TSH", "T4TOTAL", "FREET4", "T3FREE", "THYROIDAB" in the last 72 hours. Anemia Panel: No results for input(s): "VITAMINB12", "FOLATE", "FERRITIN", "TIBC", "IRON", "RETICCTPCT" in the last 72 hours. Urine analysis:    Component Value Date/Time   COLORURINE YELLOW 10/04/2021 1830   APPEARANCEUR HAZY (A) 10/04/2021 1830   APPEARANCEUR Cloudy (A) 05/16/2019 1304   LABSPEC 1.010 10/04/2021 1830   PHURINE 7.0 10/04/2021 1830   GLUCOSEU NEGATIVE 10/04/2021 1830   HGBUR MODERATE (A) 10/04/2021 1830   BILIRUBINUR NEGATIVE 10/04/2021 1830   BILIRUBINUR Negative 05/16/2019 1304   KETONESUR NEGATIVE 10/04/2021 1830   PROTEINUR NEGATIVE 10/04/2021 1830   NITRITE NEGATIVE 10/04/2021 1830   LEUKOCYTESUR MODERATE (A) 10/04/2021 1830   Sepsis Labs: @LABRCNTIP (procalcitonin:4,lacticidven:4) )No results found for this or any previous visit (from the past 240 hours).   Radiological Exams on Admission:   Assessment/Plan Principal Problem:   Acute on chronic systolic CHF (congestive heart failure) (HCC) Active Problems:   Myocardial injury   Atrial fibrillation, chronic (HCC)   Bronchiectasis without complication  (HCC)   COPD (chronic obstructive pulmonary disease) (HCC)   Essential hypertension   HLD (hyperlipidemia)   Thrombocytopenia (HCC)   Multiple sclerosis (HCC)   Benign prostatic hyperplasia without lower urinary tract symptoms   Depression with anxiety   Overweight (BMI 25.0-29.9)   Assessment and Plan:   Acute on chronic systolic CHF (congestive heart failure) [facility]): Patient has SOB, significantly elevated BNP 4363, bilateral 2+ leg edema, positive JVD, crackles on auscultation, vascular congestion on chest x-ray, clinically consistent with CHF exacerbation.  2D echo on 08/23/2022 showed EF 30-35%.  -Will admit to PCU as inpatient -Wean off BiPAP -Wean off nitroglycerin drip -Lasix 40 mg bid by IV -Continue home spironolactone -2d echo -Daily weights -strict I/O's -Low salt diet -Fluid restriction -As needed bronchodilators for shortness of breath  Myocardial injury: trop 35. No CP.  Likely demand ischemia. -Trend troponin -Lipitor -Will give aspirin since patient is on Eliquis - Follow-up 2D echo  Atrial fibrillation, chronic (HCC): Heart rate 114 --> 74 - Eliquis - As needed IV metoprolol 2.5 mg every 2 hour for heart rate> 125.  Bronchiectasis without complication (HCC) and COPD (chronic obstructive pulmonary disease) (HCC): - Bronchodilators and as needed Mucinex  Essential hypertension -IV hydralazine as needed - Amlodipine, Cozaar,  HLD (hyperlipidemia) -Lipitor  Thrombocytopenia (HCC): This is chronic issue.  Platelet 123.  No active bleeding. -Follow-up with CBC  Multiple sclerosis (HCC): Stable.  Patient is not taking medications currently. -Fall precaution  Benign prostatic hyperplasia without lower urinary tract symptoms -Continue oxybutynin  Depression with anxiety -Continue home Xanax, Wellbutrin, Zoloft, Topamax  Overweight (BMI 25.0-29.9): Body weight 88 kg, BMI 26.31 - Encourage losing weight - Exercise and healthy diet  Parkinson's  disease - Sinemet     DVT ppx: on Eliquis  Code Status: Full code    Family Communication:     not done, no family member is at bed side.      Disposition Plan:  Anticipate discharge back to previous environment  Consults called:  none  Admission status and Level of care: Progressive:  as inpt        Dispo: The patient is from: Home              Anticipated d/c is to: Home              Anticipated d/c date is: 2 days              Patient currently is not medically stable to d/c.    Severity of Illness:  The appropriate patient status for this patient is INPATIENT. Inpatient status is judged to be reasonable and necessary in order to provide the required intensity of service to ensure the patient's safety. The patient's presenting symptoms, physical exam findings, and initial radiographic and laboratory data in the context of their chronic comorbidities is felt to place them at high risk for further clinical deterioration. Furthermore, it is not anticipated that the patient will be medically stable for discharge from the hospital within 2 midnights of admission.   * I certify that at the point of admission it is my clinical judgment that the patient will require inpatient hospital care spanning beyond 2 midnights from the point of admission due to high intensity of service, high risk for further deterioration and high frequency of surveillance required.*       Date of Service 04/09/2023    Lorretta Harp [facility]   If 7PM-7AM, please contact night-coverage [email or web address] 04/09/2023, 1:29 AM

## 2023-04-08 NOTE — ED Provider Notes (Signed)
 [facility] Provider Note    Event Date/Time   First MD Initiated Contact with Patient 04/08/23 2113     (approximate)   History   Respiratory Distress   HPI Jordan [name] is a 78 y.o. male with history of COPD presenting today for shortness of breath.  Patient had acute onset shortness of breath earlier today while at home.  Had intermittent dry cough.  Otherwise denied fever, chills, congestion, chest pain, abdominal pain, nausea, vomiting.  They have noted swelling to his lower extremities recently which is new.  Denies prior history of heart failure.     Physical Exam   Triage Vital Signs: ED Triage Vitals  Encounter Vitals Group     BP 04/08/23 2115 (!) 181/117     Systolic BP Percentile --      Diastolic BP Percentile --      Pulse Rate 04/08/23 2129 (!) 114     Resp 04/08/23 2115 (!) 38     Temp 04/08/23 2129 98.2 F (36.8 C)     Temp Source 04/08/23 2129 Axillary     SpO2 04/08/23 2115 99 %     Weight 04/08/23 2132 194 lb (88 kg)     Height 04/08/23 2132 6' (1.829 m)     Head Circumference --      Peak Flow --      Pain Score 04/08/23 2132 0     Pain Loc --      Pain Education --      Exclude from Growth Chart --     Most recent vital signs: Vitals:   04/08/23 2230 04/08/23 2300  BP: 132/80 132/83  Pulse: (!) 105 98  Resp: (!) 21 (!) 22  Temp:    SpO2: 97% 95%   Physical Exam: I have reviewed the vital signs and nursing notes. General: Awake, alert, no acute distress.  Nontoxic appearing. Head:  Atraumatic, normocephalic.   ENT:  EOM intact, PERRL. Oral mucosa is pink and moist with no lesions. Neck: Neck is supple with full range of motion, No meningeal signs. Cardiovascular:  RRR, No murmurs. Peripheral pulses palpable and equal bilaterally. Respiratory:  Symmetrical chest wall expansion.  Tachypnea with crackles noted throughout.  Accessory muscle usage. Musculoskeletal:  No cyanosis.  2+ pitting edema to bilateral lower  extremities.  Moving extremities with full ROM Abdomen:  Soft, nontender, nondistended. Neuro:  GCS 15, moving all four extremities, interacting appropriately. Speech clear. Psych:  Calm, appropriate.   Skin:  Warm, dry, no rash.    ED Results / Procedures / Treatments   Labs (all labs ordered are listed, but only abnormal results are displayed) Labs Reviewed  CBC WITH DIFFERENTIAL/PLATELET - Abnormal; Notable for the following components:      Result Value   RDW 16.5 (*)    Platelets 123 (*)    All other components within normal limits  BASIC METABOLIC PANEL WITH GFR - Abnormal; Notable for the following components:   CO2 16 (*)    Glucose, Bld 262 (*)    Calcium 8.4 (*)    All other components within normal limits  BRAIN NATRIURETIC PEPTIDE - Abnormal; Notable for the following components:   B Natriuretic Peptide 4,363.7 (*)    All other components within normal limits  TROPONIN I (HIGH SENSITIVITY) - Abnormal; Notable for the following components:   Troponin I (High Sensitivity) 35 (*)    All other components within normal limits  TROPONIN I (HIGH SENSITIVITY)  EKG My EKG interpretation: Rate of 90, atrial fibrillation.  Right bundle branch block.  No acute ST elevation or depression   RADIOLOGY My chest x-ray interpretation: Vascular congestion bilaterally with bilateral pleural effusions   PROCEDURES:  Critical Care performed: Yes, see critical care procedure note(s)  .Critical Care  Performed by: Janith Lima, MD Authorized by: Janith Lima, MD   Critical care provider statement:    Critical care time (minutes):  30   Critical care was necessary to treat or prevent imminent or life-threatening deterioration of the following conditions:  Respiratory failure and cardiac failure   Critical care was time spent personally by me on the following activities:  Development of treatment plan with patient or surrogate, discussions with consultants, evaluation of  patient's response to treatment, examination of patient, ordering and review of laboratory studies, ordering and review of radiographic studies, ordering and performing treatments and interventions, pulse oximetry, re-evaluation of patient's condition and review of old charts   I assumed direction of critical care for this patient from another provider in my specialty: no     Care discussed with: admitting provider      MEDICATIONS ORDERED IN ED: Medications  nitroGLYCERIN (NITROGLYN) 2 % ointment 0.5 inch (0 inches Topical Hold 04/08/23 2135)  nitroGLYCERIN 50 mg in dextrose 5 % 250 mL (0.2 mg/mL) infusion (40 mcg/min Intravenous New Bag/Given 04/08/23 2123)  albuterol (VENTOLIN HFA) 108 (90 Base) MCG/ACT inhaler 2 puff (has no administration in time range)  dextromethorphan-guaiFENesin (MUCINEX DM) 30-600 MG per 12 hr tablet 1 tablet (has no administration in time range)  ondansetron (ZOFRAN) injection 4 mg (has no administration in time range)  hydrALAZINE (APRESOLINE) injection 5 mg (has no administration in time range)  acetaminophen (TYLENOL) tablet 650 mg (has no administration in time range)  furosemide (LASIX) injection 40 mg (40 mg Intravenous Given 04/08/23 2302)     IMPRESSION / MDM / ASSESSMENT AND PLAN / ED COURSE  I reviewed the triage vital signs and the nursing notes.                              Differential diagnosis includes, but is not limited to, CHF exacerbation, flash pulmonary edema, lower suspicion for COPD or pneumonia  Patient's presentation is most consistent with acute presentation with potential threat to life or bodily function.  Patient is a 78 year old male presenting today in respiratory distress.  Notable crackles throughout along with 2+ pitting edema.  Bedside ultrasound shows B-lines bilaterally consistent with flash pulmonary edema from CHF.  Patient transition to BiPAP and started on nitro drip with significant improvement in symptoms.  X-ray confirms  pulmonary edema as well as severely elevated BNP.  No chest pain and first troponin relatively mild at 35.  Patient was able to be transition back onto the BiPAP with continued nitro drip.  Will admit to hospitalist for ongoing care and CHF workup.  The patient is on the cardiac monitor to evaluate for evidence of arrhythmia and/or significant heart rate changes. Clinical Course as of 04/08/23 2305  Wynelle Link Apr 08, 2023  2119 Bedside ultrasound shows B-lines throughout.  Will apply Nitropaste and started on nitro drip. [DW]  2140 Patient noting improving breathing symptoms after initiation of nitro drip. [DW]    Clinical Course User Index [DW] Janith Lima, MD     FINAL CLINICAL IMPRESSION(S) / ED DIAGNOSES   Final diagnoses:  Flash pulmonary edema (HCC)  Acute congestive heart failure, unspecified heart failure type (HCC)  Acute respiratory failure, unspecified whether with hypoxia or hypercapnia (HCC)     Rx / DC Orders   ED Discharge Orders     None        Note:  This document was prepared using Dragon voice recognition software and may include unintentional dictation errors.   Janith Lima, MD 04/08/23 [id number]

## 2023-04-09 ENCOUNTER — Inpatient Hospital Stay (HOSPITAL_COMMUNITY)
Admit: 2023-04-09 | Discharge: 2023-04-09 | Disposition: A | Discharge status: Home / Self Care | Attending: Internal Medicine | Admitting: Internal Medicine

## 2023-04-09 ENCOUNTER — Other Ambulatory Visit: Payer: Self-pay

## 2023-04-09 ENCOUNTER — Encounter: Payer: Self-pay | Admitting: Internal Medicine

## 2023-04-09 DIAGNOSIS — I5023 Acute on chronic systolic (congestive) heart failure: Secondary | ICD-10-CM

## 2023-04-09 LAB — BASIC METABOLIC PANEL WITH GFR
Anion gap: 9 (ref 5–15)
BUN: 16 mg/dL (ref 8–23)
CO2: 25 mmol/L (ref 22–32)
Calcium: 8.1 mg/dL — ABNORMAL LOW (ref 8.9–10.3)
Chloride: 106 mmol/L (ref 98–111)
Creatinine, Ser: 0.89 mg/dL (ref 0.61–1.24)
GFR, Estimated: >60 mL/min (ref >60)
Glucose, Bld: 143 mg/dL — ABNORMAL HIGH (ref 70–99)
Potassium: 3.1 mmol/L — ABNORMAL LOW (ref 3.5–5.1)
Sodium: 140 mmol/L (ref 135–145)

## 2023-04-09 LAB — PHOSPHORUS: Phosphorus: 3.3 mg/dL (ref 2.5–4.6)

## 2023-04-09 LAB — CBC
HCT: 33.7 % — ABNORMAL LOW (ref 39.0–52.0)
Hemoglobin: 11.2 g/dL — ABNORMAL LOW (ref 13.0–17.0)
MCH: 30.8 pg (ref 26.0–34.0)
MCHC: 33.2 g/dL (ref 30.0–36.0)
MCV: 92.6 fL (ref 80.0–100.0)
Platelets: 112 10*3/uL — ABNORMAL LOW (ref 150–400)
RBC: 3.64 MIL/uL — ABNORMAL LOW (ref 4.22–5.81)
RDW: 16.2 % — ABNORMAL HIGH (ref 11.5–15.5)
WBC: 5.5 10*3/uL (ref 4.0–10.5)
nRBC: 0 % (ref 0.0–0.2)

## 2023-04-09 LAB — TROPONIN I (HIGH SENSITIVITY): Troponin I (High Sensitivity): 32 ng/L — ABNORMAL HIGH (ref <18)

## 2023-04-09 LAB — MAGNESIUM
Magnesium: 2 mg/dL (ref 1.7–2.4)
Magnesium: 1.9 mg/dL (ref 1.7–2.4)

## 2023-04-09 LAB — POTASSIUM: Potassium: 3.2 mmol/L — ABNORMAL LOW (ref 3.5–5.1)

## 2023-04-09 MED ORDER — FLUTICASONE-UMECLIDIN-VILANT 100-62.5-25 MCG/INH IN AEPB: 1.0000 | INHALATION_SPRAY | Freq: Every day | RESPIRATORY_TRACT | Status: DC

## 2023-04-09 MED ORDER — GABAPENTIN 400 MG PO CAPS
1200.0000 mg | ORAL_CAPSULE | Freq: Every day | ORAL | Status: DC
  Administered 2023-04-09 – 2023-04-11 (×3): 1200 mg via ORAL
  Filled 2023-04-09: qty 12
  Filled 2023-04-09 (×3): qty 3

## 2023-04-09 MED ORDER — ALPRAZOLAM 0.25 MG PO TABS: 0.2500 mg | ORAL_TABLET | Freq: Three times a day (TID) | ORAL | Status: DC

## 2023-04-09 MED ORDER — FLUTICASONE FUROATE-VILANTEROL 200-25 MCG/ACT IN AEPB
1.0000 | INHALATION_SPRAY | Freq: Every day | RESPIRATORY_TRACT | Status: DC
  Administered 2023-04-09 – 2023-04-12 (×4): 1 via RESPIRATORY_TRACT
  Filled 2023-04-09 (×2): qty 28

## 2023-04-09 MED ORDER — ZOLPIDEM TARTRATE 5 MG PO TABS: 5.0000 mg | ORAL_TABLET | Freq: Every evening | ORAL | Status: DC | PRN

## 2023-04-09 MED ORDER — SERTRALINE HCL 50 MG PO TABS
150.0000 mg | ORAL_TABLET | Freq: Every day | ORAL | Status: DC
  Administered 2023-04-10 – 2023-04-11 (×2): 150 mg via ORAL
  Filled 2023-04-09 (×3): qty 3

## 2023-04-09 MED ORDER — APIXABAN 5 MG PO TABS
5.0000 mg | ORAL_TABLET | Freq: Two times a day (BID) | ORAL | Status: DC
  Administered 2023-04-09 – 2023-04-12 (×7): 5 mg via ORAL
  Filled 2023-04-09 (×7): qty 1

## 2023-04-09 MED ORDER — BUPROPION HCL ER (SR) 100 MG PO TB12
100.0000 mg | ORAL_TABLET | Freq: Every morning | ORAL | Status: DC
  Administered 2023-04-10 – 2023-04-12 (×3): 100 mg via ORAL
  Filled 2023-04-09 (×5): qty 1

## 2023-04-09 MED ORDER — UMECLIDINIUM BROMIDE 62.5 MCG/ACT IN AEPB
1.0000 | INHALATION_SPRAY | Freq: Every day | RESPIRATORY_TRACT | Status: DC
  Administered 2023-04-12: 1 via RESPIRATORY_TRACT
  Filled 2023-04-09 (×3): qty 7

## 2023-04-09 MED ORDER — SPIRONOLACTONE 12.5 MG HALF TABLET
12.5000 mg | ORAL_TABLET | Freq: Every day | ORAL | Status: DC
  Administered 2023-04-10 – 2023-04-12 (×3): 12.5 mg via ORAL
  Filled 2023-04-09 (×4): qty 1

## 2023-04-09 MED ORDER — POTASSIUM CHLORIDE CRYS ER 20 MEQ PO TBCR
40.0000 meq | EXTENDED_RELEASE_TABLET | Freq: Once | ORAL | Status: AC
  Administered 2023-04-09: 40 meq via ORAL
  Filled 2023-04-09: qty 2

## 2023-04-09 MED ORDER — ADULT MULTIVITAMIN W/MINERALS CH
1.0000 | ORAL_TABLET | Freq: Every day | ORAL | Status: DC
  Administered 2023-04-09 – 2023-04-12 (×4): 1 via ORAL
  Filled 2023-04-09 (×5): qty 1

## 2023-04-09 MED ORDER — METOPROLOL TARTRATE 5 MG/5ML IV SOLN: 2.5000 mg | INTRAVENOUS | Status: DC | PRN

## 2023-04-09 MED ORDER — CARBIDOPA-LEVODOPA 25-100 MG PO TABS
1.0000 | ORAL_TABLET | Freq: Three times a day (TID) | ORAL | Status: DC
Start: 2023-04-09 — End: 2023-04-09

## 2023-04-09 MED ORDER — FUROSEMIDE 10 MG/ML IJ SOLN
40.0000 mg | Freq: Two times a day (BID) | INTRAMUSCULAR | Status: DC
  Administered 2023-04-09 – 2023-04-10 (×3): 40 mg via INTRAVENOUS
  Filled 2023-04-09 (×3): qty 4

## 2023-04-09 MED ORDER — TOPIRAMATE 25 MG PO TABS: 25.0000 mg | ORAL_TABLET | Freq: Two times a day (BID) | ORAL | Status: DC

## 2023-04-09 MED ORDER — OXYBUTYNIN CHLORIDE 5 MG PO TABS: 5.0000 mg | ORAL_TABLET | Freq: Three times a day (TID) | ORAL | Status: DC | PRN

## 2023-04-09 MED ORDER — MAGNESIUM SULFATE 2 GM/50ML IV SOLN
2.0000 g | Freq: Once | INTRAVENOUS | Status: AC
  Administered 2023-04-09: 2 g via INTRAVENOUS
  Filled 2023-04-09: qty 50

## 2023-04-09 MED ORDER — ATORVASTATIN CALCIUM 80 MG PO TABS
80.0000 mg | ORAL_TABLET | Freq: Every day | ORAL | Status: DC
  Administered 2023-04-09 – 2023-04-12 (×4): 80 mg via ORAL
  Filled 2023-04-09 (×2): qty 1
  Filled 2023-04-09 (×2): qty 4

## 2023-04-09 MED ORDER — LOSARTAN POTASSIUM 50 MG PO TABS
100.0000 mg | ORAL_TABLET | Freq: Every day | ORAL | Status: DC
  Administered 2023-04-09 – 2023-04-10 (×2): 100 mg via ORAL
  Filled 2023-04-09 (×2): qty 2

## 2023-04-09 MED ORDER — POTASSIUM CHLORIDE CRYS ER 20 MEQ PO TBCR
40.0000 meq | EXTENDED_RELEASE_TABLET | ORAL | Status: AC
  Administered 2023-04-09 (×2): 40 meq via ORAL
  Filled 2023-04-09 (×2): qty 2

## 2023-04-09 MED ORDER — MECLIZINE HCL 25 MG PO TABS: 25.0000 mg | ORAL_TABLET | Freq: Three times a day (TID) | ORAL | Status: DC | PRN

## 2023-04-09 MED ORDER — AMLODIPINE BESYLATE 5 MG PO TABS: 10.0000 mg | ORAL_TABLET | Freq: Every day | ORAL | Status: DC

## 2023-04-09 MED ORDER — PANTOPRAZOLE SODIUM 40 MG PO TBEC
40.0000 mg | DELAYED_RELEASE_TABLET | Freq: Every day | ORAL | Status: DC
  Administered 2023-04-09 – 2023-04-12 (×4): 40 mg via ORAL
  Filled 2023-04-09 (×4): qty 1

## 2023-04-09 NOTE — Progress Notes (Signed)
 [facility] Progress Note  Patient: Jordan [name]    ZOX:[id number]  DOA: 04/08/2023     Date of Service: the patient was seen and examined on 04/09/2023  Chief Complaint  Patient presents with   Respiratory Distress   Brief hospital course:  Jordan [name] is a 78 y.o. male with medical history significant of sCHF with EF30-35%,  A fib on Eliquis, HTN, HLD, COPD, depression with anxiety, BPH, Parkinson's disease, bronchiectasis, kidney stone, multiple sclerosis, thrombocytopenia, who presents with SOB and leg edema.   Patient states that he has shortness breath in the past several days, which has been progressively worsening.  Patient does not have cough and chest pain.  No fever or chills.  No nausea, vomiting, diarrhea or abdominal pain.  No symptoms of UTI.  Patient has worsening bilateral leg edema recently.  Patient is taking Eliquis consistently.   Per ED physician, patient does not have oxygen desaturation, but BiPAP is started in ED for acute CHF/pulmonary edema.  Nitroglycerin drip is started in ED.   Data reviewed independently and ED Course: pt was found to have BNP 4363, GFR> 60, troponin 35, temperature normal, blood pressure 181/117, heart rate 114, 73, RR 38, 18.  Chest x-ray showed cardiomegaly, vascular congestion and small left pleural effusion.  Patient is admitted to PCU as inpatient.     EKG: I have personally reviewed.  A-fib, QTc 487, poor R wave progression, heart rate 108.    Assessment and Plan:  Acute on chronic systolic CHF (congestive heart failure) [facility]): Patient has SOB, significantly elevated BNP 4363, bilateral 2+ leg edema, positive JVD, crackles on auscultation, vascular congestion on chest x-ray, clinically consistent with CHF exacerbation.  2D echo on 08/23/2022 showed EF 30-35%. -s/p Wean off BiPAP -s/p Wean off nitroglycerin drip -Lasix 40 mg bid by IV -Continue home spironolactone -2d echo -Daily weights -strict I/O's -Low salt diet -Fluid  restriction -As needed bronchodilators for shortness of breath Cardiology consulted tomorrow a.m.   Hypokalemia secondary to diuresis Potassium repleted. Monitor electrolytes   Myocardial injury: trop 35. No CP.  Likely demand ischemia. -Trend troponin -Lipitor -Will give aspirin since patient is on Eliquis - Follow-up 2D echo   Atrial fibrillation, chronic (HCC): Heart rate 114 --> 74 - Eliquis - As needed IV metoprolol 2.5 mg every 2 hour for heart rate> 125.   Bronchiectasis without complication (HCC) and COPD (chronic obstructive pulmonary disease) (HCC): - Bronchodilators and as needed Mucinex   Essential hypertension -IV hydralazine as needed - Amlodipine, Cozaar,   HLD (hyperlipidemia) -Lipitor   Thrombocytopenia (HCC): This is chronic issue.  Platelet 123.  No active bleeding. -Follow-up with CBC   Multiple sclerosis (HCC): Stable.  Patient is not taking medications currently. -Fall precaution   Benign prostatic hyperplasia without lower urinary tract symptoms -Continue oxybutynin   Depression with anxiety -Continue home Xanax, Wellbutrin, Zoloft, Topamax   Overweight (BMI 25.0-29.9): Body weight 88 kg, BMI 26.31 - Encourage losing weight - Exercise and healthy diet   Parkinson's disease - Sinemet    Body mass index is 26.31 kg/m.  Interventions:  Diet: Heart healthy diet DVT Prophylaxis: Therapeutic Anticoagulation with Eliquis    Advance goals of care discussion: Full code  Family Communication: family was present at bedside, at the time of interview.  The pt provided permission to discuss medical plan with the family. Opportunity was given to ask question and all questions were answered satisfactorily.   Disposition:  Pt is from home, admitted  with CHF, still on IV Lasix due to LE edema, which precludes a safe discharge. Discharge to home, when stable.  Subjective: No significant events overnight, patient is feeling improvement in the  shortness of breath, still has lower extremity edema. Denied any chest pain or palpitations.  Physical Exam: General: NAD, lying comfortably Appear in no distress, affect appropriate Eyes: PERRLA ENT: Oral Mucosa Clear, moist  Neck: no JVD,  Cardiovascular: Irregular rhythm, no Murmur,  Respiratory: good respiratory effort, Bilateral Air entry equal and Decreased, no Crackles, no wheezes Abdomen: Bowel Sound present, Soft and no tenderness,  Skin: no rashes Extremities: 4+ pedal edema, no calf tenderness Neurologic: without any new focal findings Gait not checked due to patient safety concerns  Vitals:   04/09/23 1335 04/09/23 1406 04/09/23 1407 04/09/23 1515  BP:   134/86 138/72  Pulse:   93 88  Resp:   15 19  Temp: 98.4 F (36.9 C)  98.5 F (36.9 C)   TempSrc: Oral  Oral   SpO2:  96% 95% 92%  Weight:      Height:        Intake/Output Summary (Last 24 hours) at 04/09/2023 1717 Last data filed at 04/09/2023 0342 Gross per 24 hour  Intake 51.56 ml  Output 1925 ml  Net -1873.44 ml   Filed Weights   04/08/23 2132  Weight: 88 kg    Data Reviewed: I have personally reviewed and interpreted daily labs, tele strips, imagings as discussed above. I reviewed all nursing notes, pharmacy notes, vitals, pertinent old records I have discussed plan of care as described above with RN and patient/family.  CBC: Recent Labs  Lab 04/08/23 2124 04/09/23 0543  WBC 9.0 5.5  NEUTROABS 6.3  --   HGB 13.1 11.2*  HCT 41.2 33.7*  MCV 94.7 92.6  PLT 123* 112*   Basic Metabolic Panel: Recent Labs  Lab 04/08/23 2124 04/09/23 0543  NA 138 140  K 4.6 3.1*  CL 111 106  CO2 16* 25  GLUCOSE 262* 143*  BUN 17 16  CREATININE 1.19 0.89  CALCIUM 8.4* 8.1*  MG  --  2.0  PHOS  --  3.3    Studies: DG Chest Portable 1 View Result Date: 04/08/2023 CLINICAL DATA:  Shortness of breath EXAM: PORTABLE CHEST 1 VIEW COMPARISON:  07/06/2022 FINDINGS: Possible small left effusion. Cardiomegaly  with mild central congestion. Bronchitic changes. No pneumothorax IMPRESSION: Cardiomegaly with mild central congestion and possible small left effusion. Electronically Signed   By: Jasmine Pang M.D.   On: 04/08/2023 22:01    Scheduled Meds:  apixaban  5 mg Oral BID   atorvastatin  80 mg Oral Daily   buPROPion ER  100 mg Oral q morning   fluticasone furoate-vilanterol  1 puff Inhalation Daily   And   umeclidinium bromide  1 puff Inhalation Daily   furosemide  40 mg Intravenous Q12H   gabapentin  1,200 mg Oral QHS   losartan  100 mg Oral Daily   multivitamin with minerals  1 tablet Oral Daily   nitroGLYCERIN  0.5 inch Topical Once   pantoprazole  40 mg Oral Daily   sertraline  150 mg Oral Daily   spironolactone  12.5 mg Oral Daily   Continuous Infusions:  nitroGLYCERIN Stopped (04/09/23 1347)   PRN Meds: acetaminophen, albuterol, dextromethorphan-guaiFENesin, hydrALAZINE, metoprolol tartrate, ondansetron (ZOFRAN) IV  Time spent: 55 minutes  Author: Gillis Santa. MD [facility] 04/09/2023 5:17 PM  To reach On-call, see care teams to  locate the attending and reach out to them via [email or web address]. If 7PM-7AM, please contact night-coverage If you still have difficulty reaching the attending provider, please page the [facility] (Director on Call) for [facility] on amion for assistance.

## 2023-04-09 NOTE — ED Notes (Signed)
Pt given chocolate ice cream

## 2023-04-09 NOTE — ED Notes (Signed)
 Pt was ambulatory to the restroom with 1 person assist

## 2023-04-09 NOTE — ED Notes (Signed)
 This RN gave report to [employer] and performed care handoff via phone. Pt updated on plan of care.

## 2023-04-09 NOTE — ED Notes (Signed)
  This RN received report from Derinda Sis RN and performed care handoff. This RN introduced self to pt. Call light in reach, bed wheels locked, side rail raised, pt updated on plan of care. Rounding completed. Fall risk interventions in place.

## 2023-04-09 NOTE — ED Notes (Signed)
 Verbal orders from Dr Arville Care for potassium and magnesium replacement due to repeat electrolyte draw.

## 2023-04-09 NOTE — Progress Notes (Signed)
*  PRELIMINARY RESULTS* Echocardiogram 2D Echocardiogram has been performed.  Carolyne Fiscal 04/09/2023, 12:23 PM

## 2023-04-10 ENCOUNTER — Encounter: Payer: Self-pay | Admitting: Internal Medicine

## 2023-04-10 DIAGNOSIS — I4891 Unspecified atrial fibrillation: Secondary | ICD-10-CM

## 2023-04-10 DIAGNOSIS — I5023 Acute on chronic systolic (congestive) heart failure: Secondary | ICD-10-CM | POA: Diagnosis not present

## 2023-04-10 LAB — BASIC METABOLIC PANEL WITH GFR
Anion gap: 10 (ref 5–15)
BUN: 16 mg/dL (ref 8–23)
CO2: 26 mmol/L (ref 22–32)
Calcium: 8.3 mg/dL — ABNORMAL LOW (ref 8.9–10.3)
Chloride: 104 mmol/L (ref 98–111)
Creatinine, Ser: 0.8 mg/dL (ref 0.61–1.24)
GFR, Estimated: >60 mL/min (ref >60)
Glucose, Bld: 90 mg/dL (ref 70–99)
Potassium: 3.1 mmol/L — ABNORMAL LOW (ref 3.5–5.1)
Sodium: 140 mmol/L (ref 135–145)

## 2023-04-10 LAB — ECHOCARDIOGRAM COMPLETE
AR max vel: 2.43 cm2
AV Area VTI: 3.03 cm2
AV Area mean vel: 2.65 cm2
AV Mean grad: 2.7 mmHg
AV Peak grad: 5.1 mmHg
Ao pk vel: 1.13 m/s
Area-P 1/2: 3.87 cm2
Height: 72 in
MV VTI: 2.64 cm2
P 1/2 time: 538 ms
S' Lateral: 3.6 cm
Weight: 3104 [oz_av]

## 2023-04-10 LAB — CBC
HCT: 34 % — ABNORMAL LOW (ref 39.0–52.0)
Hemoglobin: 11.5 g/dL — ABNORMAL LOW (ref 13.0–17.0)
MCH: 30.4 pg (ref 26.0–34.0)
MCHC: 33.8 g/dL (ref 30.0–36.0)
MCV: 89.9 fL (ref 80.0–100.0)
Platelets: 137 10*3/uL — ABNORMAL LOW (ref 150–400)
RBC: 3.78 MIL/uL — ABNORMAL LOW (ref 4.22–5.81)
RDW: 16 % — ABNORMAL HIGH (ref 11.5–15.5)
WBC: 9.7 10*3/uL (ref 4.0–10.5)
nRBC: 0 % (ref 0.0–0.2)

## 2023-04-10 LAB — MAGNESIUM: Magnesium: 2.4 mg/dL (ref 1.7–2.4)

## 2023-04-10 LAB — PHOSPHORUS: Phosphorus: 2.3 mg/dL — ABNORMAL LOW (ref 2.5–4.6)

## 2023-04-10 LAB — TSH: TSH: 3.899 u[IU]/mL (ref 0.350–4.500)

## 2023-04-10 MED ORDER — LOSARTAN POTASSIUM 50 MG PO TABS
50.0000 mg | ORAL_TABLET | Freq: Every day | ORAL | Status: DC
  Administered 2023-04-11: 50 mg via ORAL
  Filled 2023-04-10: qty 1

## 2023-04-10 MED ORDER — K PHOS MONO-SOD PHOS DI & MONO 155-852-130 MG PO TABS
500.0000 mg | ORAL_TABLET | Freq: Four times a day (QID) | ORAL | Status: AC
  Administered 2023-04-10 (×2): 500 mg via ORAL
  Filled 2023-04-10 (×4): qty 2

## 2023-04-10 MED ORDER — POTASSIUM CHLORIDE CRYS ER 20 MEQ PO TBCR
40.0000 meq | EXTENDED_RELEASE_TABLET | Freq: Every day | ORAL | Status: DC
  Filled 2023-04-10: qty 2

## 2023-04-10 MED ORDER — POTASSIUM CHLORIDE CRYS ER 20 MEQ PO TBCR
40.0000 meq | EXTENDED_RELEASE_TABLET | ORAL | Status: AC
  Administered 2023-04-10 (×2): 40 meq via ORAL
  Filled 2023-04-10 (×2): qty 2

## 2023-04-10 MED ORDER — METOPROLOL TARTRATE 5 MG/5ML IV SOLN: 5.0000 mg | INTRAVENOUS | Status: DC | PRN

## 2023-04-10 MED ORDER — FUROSEMIDE 10 MG/ML IJ SOLN
40.0000 mg | Freq: Once | INTRAMUSCULAR | Status: AC
  Administered 2023-04-10: 40 mg via INTRAVENOUS
  Filled 2023-04-10: qty 4

## 2023-04-10 MED ORDER — SODIUM CHLORIDE 0.9 % IV SOLN: INTRAVENOUS | Status: DC

## 2023-04-10 MED ORDER — METOPROLOL TARTRATE 50 MG PO TABS
50.0000 mg | ORAL_TABLET | Freq: Two times a day (BID) | ORAL | Status: DC
  Administered 2023-04-10 – 2023-04-12 (×5): 50 mg via ORAL
  Filled 2023-04-10 (×3): qty 1
  Filled 2023-04-10: qty 2
  Filled 2023-04-10: qty 1

## 2023-04-10 MED ORDER — TORSEMIDE 20 MG PO TABS: 40.0000 mg | ORAL_TABLET | Freq: Every day | ORAL | Status: DC

## 2023-04-10 NOTE — Consult Note (Addendum)
 Cardiology Consultation   Patient ID: Jordan [name] MRN: [id number]; DOB: 01-16-1945  Admit date: 04/08/2023 Date of Consult: 04/10/2023  PCP:  Damaris Schooner, DO    HeartCare Providers Cardiologist:  None        Patient Profile:   Jordan [name] is a 78 y.o. male with a hx of HFrEF, paroxysmal A-fib on Eliquis, hypertension, hyperlipidemia, COPD, depression with anxiety, BPH, Parkinson's disease, bronchiectasis, MS, thrombocytopenia who is being seen 04/10/2023 for the evaluation of A-fib and acute on chronic HFrEF at the request of Dr. Lucianne Muss.  History of Present Illness:   Jordan [name] was admitted to [facility] 07/2022 after a fall in the parking lot resulting in complex facial fracture and comminuted right humerus fracture. This admission was complicated by pneumonia. At rehab, noted to have new onset atrial fibrillation. Cardiology was consulted and recommended starting metoprolol 50 mg twice daily, nitroglycerin as needed, and Eliquis 5 mg twice daily.  Echo 07/2022 showed EF 55% and mild MR. Repeat limited echo 08/2022 showed EF 30 to 35%. He was seen in outpatient follow-up with Jordan [name] cardiology 09/19/2022 who recommended continuation of metoprolol and Eliquis for paroxysmal afib and spironolactone, metoprolol succinate, and losartan for HFrEF. Recommended lexiscan MPI for further evaluation of reduced ejection fraction, done 09/2022 and showed abnormal myocardial perfusion study with moderate size, mild in severity, partially reversible perfusion defect consistent with possible ischemia or artifact. EF calculated at 42%. Coronary calcifications were noted. Follow-up coronary CTA was done 10/2022 which showed nonflow limiting coronary disease.  Patient reports worsening shortness of breath and dry cough for the past week. He uses a device that connects to his phone which showed that he was not in sinus rhythm. He uses this device to check his heart rhythm approximately once per week and prior to  this, it was showing that he was in sinus rhythm consistently for the past several months. However, he was not sure if this was contributing to his symptoms at the time. On Sunday, he had sudden onset respiratory distress and could not catch his breath.  He also noted bilateral lower extremity edema.  He denies chest pain, palpitations, orthopnea, PND, diaphoresis, fever, nausea, and bleeding. EMS was called and initiated CPAP. On arrival to the ED, BP 181/117, heart rate 114, RR 38 with otherwise normal [name] signs. Initial CBC and CMP largely unremarkable. BNP elevated at 4363. Troponin 35>32. EKG showed atrial fibrillation.  Bedside ultrasound was done which showed B-lines bilaterally consistent with flash pulmonary edema. Patient was transitioned to BiPAP and started on nitro drip with significant improvement in symptoms. Chest x-ray confirmed pulmonary edema with small left pleural effusion. He was started on IV Lasix 40 mg twice daily with significant urine output and improvement in volume status. Cardiology was consulted for acute on chronic HFrEF and atrial fibrillation.  Past Medical History:  Diagnosis Date   Anemia 2018   Anxiety    COPD (chronic obstructive pulmonary disease) (HCC)    Depression    GERD (gastroesophageal reflux disease)    History of kidney stones    Hypertension    Kidney stone    MS (multiple sclerosis) (HCC)    Restless leg     Past Surgical History:  Procedure Laterality Date   CYSTOSCOPY W/ RETROGRADES Left 05/06/2019   Procedure: CYSTOSCOPY WITH RETROGRADE PYELOGRAM;  Surgeon: Riki Altes, MD;  Location: [facility] ORS;  Service: Urology;  Laterality: Left;   CYSTOSCOPY/URETEROSCOPY/HOLMIUM LASER/STENT PLACEMENT Left 05/06/2019   Procedure:  CYSTOSCOPY/URETEROSCOPY/HOLMIUM LASER/STENT PLACEMENT;  Surgeon: Riki Altes, MD;  Location: [facility] ORS;  Service: Urology;  Laterality: Left;       Inpatient Medications: Scheduled Meds:  apixaban  5 mg Oral BID    atorvastatin  80 mg Oral Daily   buPROPion ER  100 mg Oral q morning   fluticasone furoate-vilanterol  1 puff Inhalation Daily   And   umeclidinium bromide  1 puff Inhalation Daily   furosemide  40 mg Intravenous Q12H   gabapentin  1,200 mg Oral QHS   [START ON 04/11/2023] losartan  50 mg Oral Daily   metoprolol tartrate  50 mg Oral BID   multivitamin with minerals  1 tablet Oral Daily   nitroGLYCERIN  0.5 inch Topical Once   pantoprazole  40 mg Oral Daily   phosphorus  500 mg Oral QID   potassium chloride  40 mEq Oral Q4H   sertraline  150 mg Oral Daily   spironolactone  12.5 mg Oral Daily   Continuous Infusions:  nitroGLYCERIN Stopped (04/09/23 1347)   PRN Meds: acetaminophen, albuterol, dextromethorphan-guaiFENesin, hydrALAZINE, metoprolol tartrate, ondansetron (ZOFRAN) IV  Allergies:    Allergies  Allergen Reactions   Latex Anaphylaxis   Penicillins Hives and Rash   Primidone Shortness Of Breath   Doxycycline Other (See Comments)    Abdominal cramping     Social History:   Social History   Socioeconomic History   Marital status: Married    Spouse name: Not on file   Number of children: Not on file   Years of education: Not on file   Highest education level: Not on file  Occupational History   Not on file  Tobacco Use   Smoking status: Former   Smokeless tobacco: Never  Vaping Use   Vaping status: Never Used  Substance and Sexual Activity   Alcohol use: Yes    Comment: beer with dinner   Drug use: Never   Sexual activity: Not Currently  Other Topics Concern   Not on file  Social History Narrative   Not on file   Social Drivers of Health   Financial Resource Strain: Low Risk  (08/25/2022)   Received from [facility]   Overall Financial Resource Strain (CARDIA)    Difficulty of Paying Living Expenses: Not hard at all  Food Insecurity: No Food Insecurity (10/31/2022)   Received from [facility]   Hunger [name] Sign    Worried  About Running Out of Food in the Last Year: Never true    Ran Out of Food in the Last Year: Never true  Transportation Needs: No Transportation Needs (10/31/2022)   Received from [facility]   PRAPARE - Transportation    Lack of Transportation (Medical): No    Lack of Transportation (Non-Medical): No  Physical Activity: Insufficiently Active (04/30/2018)   Received from [facility], [facility]   Exercise [name] Sign    Days of Exercise per Week: 5 days    Minutes of Exercise per Session: 10 min  Stress: No Stress Concern Present (04/30/2018)   Received from [facility], [facility]   [employer] of Occupational Health - Occupational Stress Questionnaire    Feeling of Stress : Not at all  Social Connections: Moderately Isolated (04/30/2018)   Received from [facility], [facility]   Social Connection and Isolation Panel [NHANES]    Frequency of Communication with Friends and Family:  More than three times a week    Frequency of Social Gatherings with Friends and Family: More than three times a week    Attends Religious Services: Never    Database administrator or Organizations: No    Attends Engineer, structural: Never    Marital Status: Married  Catering manager Violence: Not on file    Family History:    Family History  Problem Relation Age of Onset   Heart disease Mother    Diabetes Father    Diabetes Brother      ROS:  Please see the history of present illness.   Physical Exam/Data:   Vitals:   04/10/23 1000 04/10/23 1044 04/10/23 1300 04/10/23 1411  BP: (!) 157/113 (!) 157/113 (!) 160/87   Pulse: (!) 115 (!) 116 98   Resp: 18  13   Temp:    98.3 F (36.8 C)  TempSrc:    Oral  SpO2: 93%  96%   Weight:      Height:        Intake/Output Summary (Last 24 hours) at 04/10/2023 1535 Last data filed at 04/10/2023 0802 Gross per 24 hour  Intake 50  ml  Output 4175 ml  Net -4125 ml      04/08/2023    9:32 PM 11/16/2022    4:37 PM 07/06/2022   12:56 PM  Last 3 Weights  Weight (lbs) 194 lb 194 lb 216 lb  Weight (kg) 87.998 kg 87.998 kg 97.977 kg     Body mass index is 26.31 kg/m.  General:  Well nourished, well developed, in no acute distress HEENT: normal Neck: no JVD Vascular: No carotid bruits; Distal pulses 2+ bilaterally Cardiac: IRIR; normal S1, S2; no murmur  Lungs:  clear to auscultation bilaterally, no wheezing, rhonchi or rales  Abd: soft, nontender, no hepatomegaly  Ext: no edema Skin: warm and dry  Psych:  Normal affect   EKG:  The EKG was personally reviewed and demonstrates:  Atrial fibrillation with rate 108 bpm Telemetry:  Telemetry was personally reviewed and demonstrates:  Atrial fibrillation with rate overall controlled in the 100s, up to 120s at times  Relevant CV Studies:  04/09/2023 Echo complete 1. Left ventricular ejection fraction, by estimation, is 40 to 45%. The  left ventricle has mildly decreased function. The left ventricle  demonstrates global hypokinesis. Left ventricular diastolic parameters are  consistent with Grade I diastolic  dysfunction (impaired relaxation).   2. Right ventricular systolic function is normal. The right ventricular  size is normal. There is moderately elevated pulmonary artery systolic  pressure.   3. Left atrial size was severely dilated.   4. Right atrial size was severely dilated.   5. The mitral valve is normal in structure. Moderate mitral valve  regurgitation. No evidence of mitral stenosis.   6. Tricuspid valve regurgitation is moderate.   7. The aortic valve is normal in structure. Aortic valve regurgitation is  mild. No aortic stenosis is present.   8. The inferior vena cava is normal in size with greater than 50%  respiratory variability, suggesting right atrial pressure of 3 mmHg.   10/20/2022 Coronary CTA [facility]) - Non flow-limiting coronary disease as  described below  Left Main:  - Ostial Left Main: no significant plaque or stenosis  - Left Main: 10% stenosis (calcified plaque)  Left Anterior Descending:  - Ostial LAD: 20 to 30% stenosis (calcified plaque, serial lesions)  - Proximal LAD: 20 to 30% stenosis (calcified plaque, serial  lesions).  - Mid LAD: 20 to 30% stenosis (calcified plaque, serial lesions)  - Distal LAD: no significant plaque or stenosis  - First diagonal: 30 to 40% stenosis (calcified plaque, serial lesions)  Left Circumflex:  - Ostial LCx: 10% stenosis (calcified plaque)  - Proximal LCx: 10% stenosis (calcified plaque)  - Mid LCx: 10% stenosis (calcified plaque)  - Distal LCx: is not well visualized  - OM1: no significant plaque or stenosis  - OM2: small caliber with no significant plaque or stenosis  Ramus:  - Ramus: 50% stenosis (calcified plaque, bulky plaque, serial lesions)  Right coronary artery:  - Ostial RCA: calcified plaque, without significant luminal stenosis  - Proximal RCA: 20 to 30% stenosis (calcified plaque, serial lesions)  - Mid RCA: 20 to 30% stenosis (calcified plaque, serial lesions)  - Distal RCA: 20 to 30% stenosis (calcified plaque, serial lesions)  - PDA: 30 to 40% stenosis (calcified plaque)  - RCA continuation: no significant plaque or stenosis  - PL1: no significant plaque or stenosis   09/27/2022 NM Myocardial Perfusion ([facility]) - Abnormal myocardial perfusion study  - There is a moderate sized, mild in severity, partially reversible  perfusion defect involving the basal inferolateral, mid inferolateral and  apical lateral segments. This is consistent with possible ischemia or  artifact (adjacent gut activity, arm at side, motion).  - Left ventricular systolic function is moderately reduced. Post stress  the ejection fraction is calculated at 42%.  - Coronary calcifications are noted  - CT finding show emphysematous changes of the lungs   08/23/2022 Echo limited [facility]) 1. The left  ventricle is mildly dilated in size with mildly increased wall  thickness.   2. The left ventricular systolic function is moderately decreased, LVEF is  visually estimated at 30-35%. The anteroseptal and inferoseptal walls are  akinetic. The apical, inferior and anterior walls are hypokinetic.    3. There is mild centrally directed mitral regurgitation.    4. The right ventricle is not well visualized but probably normal in size,  with normal systolic function.    5. Compared to prior study from 07/14/2022, the LV function is reduced and  there are new LV wall motion abnormalities as seen in coronary artery disease.   Laboratory Data:  High Sensitivity Troponin:   Recent Labs  Lab 04/08/23 2124 04/08/23 2326  TROPONINIHS 35* 32*     Chemistry Recent Labs  Lab 04/08/23 2124 04/09/23 0543 04/09/23 2139 04/10/23 0505  NA 138 140  --  140  K 4.6 3.1* 3.2* 3.1*  CL 111 106  --  104  CO2 16* 25  --  26  GLUCOSE 262* 143*  --  90  BUN 17 16  --  16  CREATININE 1.19 0.89  --  0.80  CALCIUM 8.4* 8.1*  --  8.3*  MG  --  2.0 1.9 2.4  GFRNONAA >60 >60  --  >60  ANIONGAP 11 9  --  10    No results for input(s): "PROT", "ALBUMIN", "AST", "ALT", "ALKPHOS", "BILITOT" in the last 168 hours. Lipids No results for input(s): "CHOL", "TRIG", "HDL", "LABVLDL", "LDLCALC", "CHOLHDL" in the last 168 hours.  Hematology Recent Labs  Lab 04/08/23 2124 04/09/23 0543 04/10/23 0505  WBC 9.0 5.5 9.7  RBC 4.35 3.64* 3.78*  HGB 13.1 11.2* 11.5*  HCT 41.2 33.7* 34.0*  MCV 94.7 92.6 89.9  MCH 30.1 30.8 30.4  MCHC 31.8 33.2 33.8  RDW 16.5* 16.2* 16.0*  PLT 123* 112* 137*  Thyroid No results for input(s): "TSH", "FREET4" in the last 168 hours.  BNP Recent Labs  Lab 04/08/23 2124  BNP 4,363.7*    DDimer No results for input(s): "DDIMER" in the last 168 hours.   Radiology/Studies:  DG Chest Portable 1 View Result Date: 04/08/2023 IMPRESSION: Cardiomegaly with mild central congestion and  possible small left effusion. Electronically Signed   By: Jasmine Pang M.D.   On: 04/08/2023 22:01   Assessment and Plan:   Acute on chronic HFrEF with flash pulmonary edema - Presented 4/6 with respiratory distress and worsening DOE with associated LE edema - Prior echo at [facility] 08/2022 with EF 30-35% with global hypokinesis - Bedside US in ED with findings consistent with flash pulmonary edema. Started on BiPAP and nitroglycerin drip, now weaned off both.  - Echo this admission with EF 40-45%, global hypokinesis, G1DD, moderately elevated pulmonary artery systolic pressure, severe dilation of RA and LA, moderate MR, and mild AR - BNP 4363 - Started on IV Lasix 40 mg twice daily with -6 L output since admission - Volume status appears to be improving - Kidney function stable - Suspect CHF exacerbation is being driven by atrial fibrillation - Continue IV Lasix 40 mg twice daily - Continue to monitor kidney function, strict I/Os, daily weights - Continue losartan 50 mg daily, metoprolol tartrate 50 mg twice daily, and spironolactone 12/5 mg daily  Paroxysmal atrial fibrillation - Patient with history of paroxysmal atrial fibrillation dating back to 07/2022 - EKG and telemetry show atrial fibrillation with rates largely controlled in the 100s bpm - Suspect afib is driving his acute HFrEF symptoms - Check TSH - Recommend K > 4 and mag > 2 - Continued on PTA Eliquis 5 mg without interruption, patient denies any missed doses of Eliquis - Discussed options for management with patient and wife including medical management vs DCCV. Shared decision to proceed with DCCV tentatively scheduled for tomorrow. NPO after midnight.  - Continue metoprolol tartrate 50 mg twice daily  Informed Consent   Shared Decision Making/Informed Consent The risks (stroke, cardiac arrhythmias rarely resulting in the need for a temporary or permanent pacemaker, skin irritation or burns and complications associated with  conscious sedation including aspiration, arrhythmia, respiratory failure and death), benefits (restoration of normal sinus rhythm) and alternatives of a direct current cardioversion were explained in detail to Mr. Plouff and he agrees to proceed.       Elevated troponin Nonobstructive CAD - Minimally elevated at 35>32 in the setting of volume overload and atrial fibrillation - Recent coronary CTA at [facility] 10/2022 showing non flow-limiting coronary disease, detailed above - Echo with EF 40-45% and global hypokinesis - Denies chest pain - EKG without acute ischemic changes - Consider cardiac catheterization as outpatient  Hypokalemia - K 3.1, in the setting of diuresis - Continue repletion, recommend K > 4  Hypertension - Continue PTA antihypertensives  Hyperlipidemia - Most recent lipid panel 05/2022 with LDL 38 - Continue atorvastatin 80 mg daily  For questions or updates, please contact [facility] Please consult [email or web address] for contact info under    Signed, Orion Crook, PA-C  04/10/2023 3:35 PM

## 2023-04-10 NOTE — TOC Initial Note (Signed)
 Transition of Care [facility]) - Initial/Assessment Note    Patient Details  Name: Jordan [name] MRN: [id number] Date of Birth: 1945-09-06  [facility]) CM/SW Contact:    Jordan Broach, LCSW Phone Number: 04/10/2023, 3:21 PM  Clinical Narrative:  Csw met with patient at bedside.  His wife and a family friend were present.  CSW introduced self and reason for visit.  Readmission Prevention Assessment completed. Patient is a CHF patient.  PT/OT is pending.  However, CSW offered patient [facility] but patient declined.  He is waiting to see what PT recommends.  PCP is [employer], DO.  He uses Development worker, community and he doesn't have any concerns about paying for his medicines.  Patient lives with his wife, Jordan [name].  He has the following DME:  cane, walking stick and shower chair.  Patient's wife will transport home at discharge.  Patient states that he's recently had PT at [facility] and that worked well for him.  If PT recommends PT, patient would like to go back there for services.  PT recommendation still pending.  TOC to continue to follow.                         Patient Goals and CMS Choice            Expected Discharge Plan and Services                                              Prior Living Arrangements/Services                       Activities of Daily Living      Permission Sought/Granted                  Emotional Assessment              Admission diagnosis:  Acute on chronic systolic CHF (congestive heart failure) (HCC) [I50.23] Patient Active Problem List   Diagnosis Date Noted   Acute on chronic systolic CHF (congestive heart failure) (HCC) 04/08/2023   HLD (hyperlipidemia) 04/08/2023   Depression with anxiety 04/08/2023   Atrial fibrillation, chronic (HCC) 04/08/2023   COPD (chronic obstructive pulmonary disease) (HCC) 04/08/2023   Myocardial injury 04/08/2023   Thrombocytopenia (HCC) 04/08/2023    Overweight (BMI 25.0-29.9) 04/08/2023   Persistent atrial fibrillation (HCC) 04/08/2023   Visit for wound check 11/16/2022   Substernal chest pain 09/02/2018   Hypokalemia 09/17/2017   Traumatic complete tear of left rotator cuff 07/10/2017   Restless leg 05/10/2017   Acute exacerbation of chronic obstructive pulmonary disease (COPD) (HCC) 03/21/2017   CAP (community acquired pneumonia) 03/20/2017   Benign prostatic hyperplasia without lower urinary tract symptoms 01/12/2016   Erectile dysfunction 01/12/2016   Bronchiectasis without complication (HCC) 05/20/2014   Abnormal echocardiogram 04/16/2014   Abnormal nuclear stress test 04/16/2014   Anxiety 10/02/2012   History of adenomatous polyp of colon 10/02/2012   Chronic laryngitis 03/05/2012   Liver lesion 03/05/2012   Thyroid nodule 03/05/2012   Encounter for long-term (current) use of other medications 01/12/2012   History of migraine 01/12/2012   History of nephrolithiasis 01/12/2012   Vitamin D deficiency 01/12/2012   Asthma 08/22/2011   Daytime somnolence 08/22/2011   Essential hypertension 08/22/2011   Gastroesophageal reflux disease without  esophagitis 08/22/2011   Lipid disorder 08/22/2011   Mood disorder (HCC) 08/22/2011   Multiple sclerosis (HCC) 08/22/2011   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   Pseudophakia of both eyes 07/28/2011   Nuclear sclerotic cataract of right eye 07/14/2011   PCP:  Jordan Schooner, DO Pharmacy:   [facility] [phone] [facility], [place] - [address] AT [facility] OF [address] & [address] [place] [address] Phone: [phone] Fax: [phone]     Social Drivers of Health (SDOH) Social History: SDOH Screenings   Food Insecurity: No Food Insecurity (10/31/2022)   Received from [facility]  Housing: Unknown (01/16/2023)   Received from [facility]  Transportation Needs: No Transportation Needs (10/31/2022)   Received from [facility]  Utilities: Low Risk  (10/31/2022)   Received from [facility]  Financial Resource Strain: Low Risk  (08/25/2022)   Received from [facility]  Physical Activity: Insufficiently Active (04/30/2018)   Received from [facility], [facility]  Social Connections: Moderately Isolated (04/30/2018)   Received from [facility], [facility]  Stress: No Stress Concern Present (04/30/2018)   Received from [facility], [facility]  Tobacco Use: Medium Risk (04/08/2023)  Health Literacy: Low Risk  (08/02/2022)   Received from [facility]   SDOH Interventions:     Readmission Risk Interventions    04/10/2023    3:20 PM  Readmission Risk Prevention Plan  Transportation Screening Complete  PCP or Specialist Appt within 3-5 Days Complete  HRI or Home Care Consult Complete  Social Work Consult for Recovery Care Planning/Counseling Complete  Palliative Care Screening Not Applicable  Medication Review Oceanographer) Complete

## 2023-04-10 NOTE — ED Notes (Signed)
 This Rn notified provider of pt trending tachycardia and pt family request to be seen by bedside. Pt family asked to be transferred, this RN explained that we can not transfer unless deemed medically necessary. Pt wife verbalized understanding. They were updated on the poc.

## 2023-04-10 NOTE — Progress Notes (Addendum)
 [facility] Progress Note  Patient: Jordan [name]    ZOX:[id number]  DOA: 04/08/2023     Date of Service: the patient was seen and examined on 04/10/2023  Chief Complaint  Patient presents with   Respiratory Distress   Brief hospital course:  Jordan [name] is a 78 y.o. male with medical history significant of sCHF with EF30-35%,  A fib on Eliquis, HTN, HLD, COPD, depression with anxiety, BPH, Parkinson's disease, bronchiectasis, kidney stone, multiple sclerosis, thrombocytopenia, who presents with SOB and leg edema.   Patient states that he has shortness breath in the past several days, which has been progressively worsening.  Patient does not have cough and chest pain.  No fever or chills.  No nausea, vomiting, diarrhea or abdominal pain.  No symptoms of UTI.  Patient has worsening bilateral leg edema recently.  Patient is taking Eliquis consistently.   Per ED physician, patient does not have oxygen desaturation, but BiPAP is started in ED for acute CHF/pulmonary edema.  Nitroglycerin drip is started in ED.   Data reviewed independently and ED Course: pt was found to have BNP 4363, GFR> 60, troponin 35, temperature normal, blood pressure 181/117, heart rate 114, 73, RR 38, 18.  Chest x-ray showed cardiomegaly, vascular congestion and small left pleural effusion.  Patient is admitted to PCU as inpatient.     EKG: I have personally reviewed.  A-fib, QTc 487, poor R wave progression, heart rate 108.    Assessment and Plan:  Acute on chronic systolic CHF (congestive heart failure) [facility] Patient has SOB, significantly elevated BNP 4363, bilateral 2+ leg edema, positive JVD, crackles on auscultation, vascular congestion on chest x-ray, clinically consistent with CHF exacerbation.  2D echo on 08/23/2022 showed EF 30-35%. -s/p Wean off BiPAP -s/p Wean off nitroglycerin drip S/p Lasix 40 mg IV bid, on 4/8, started torsemide 40 mg p.o. daily from tomorrow a.m. 4/9 -Continue home  spironolactone -2d echo -Daily weights -strict I/O's -Low salt diet -Fluid restriction -As needed bronchodilators for shortness of breath Cardiology consulted   Hypokalemia secondary to diuresis Potassium repleted. Started potassium chloride 40 mEq p.o. daily Monitor electrolytes   Myocardial injury: trop 35. No CP.  Likely demand ischemia. -Trend troponin -Lipitor -Will give aspirin since patient is on Eliquis - Follow-up 2D echo   Atrial fibrillation, chronic, with RVR - Continue Eliquis Started metoprolol 50 mg p.o. twice daily - As needed IV metoprolol 2.5 mg every 2 hour for heart rate> 125.   Bronchiectasis without complication (HCC) and COPD (chronic obstructive pulmonary disease) (HCC): - Bronchodilators and as needed Mucinex   Essential hypertension -IV hydralazine as needed - Discontinued amlodipine Continue losartan 50 mg p.o. daily, Aldactone 12.5 mg p.o. daily Started metoprolol 50 mg p.o. twice daily Started torsemide 40 mg p.o. daily   HLD (hyperlipidemia): on Lipitor 80 mg p.o. daily   Thrombocytopenia: This is chronic issue.  Platelet 123.  No active bleeding. -Follow-up with CBC   Multiple sclerosis (HCC): Stable.  Patient is not taking medications currently. -Fall precaution   Benign prostatic hyperplasia without lower urinary tract symptoms -Continue oxybutynin   Depression with anxiety -Continue home Xanax, Wellbutrin, Zoloft, Topamax   Overweight (BMI 25.0-29.9): Body weight 88 kg, BMI 26.31 - Encourage losing weight - Exercise and healthy diet   Parkinson's disease Patient does not take Sinemet as per med rec    Body mass index is 26.31 kg/m.  Interventions:  Diet: Heart healthy diet DVT Prophylaxis: Therapeutic Anticoagulation with Eliquis  Advance goals of care discussion: DNR/DNI  CODE STATUS discussed on 4/8, patient would like to be DNR/DNI.  Family Communication: family was present at bedside, at the time of  interview.  The pt provided permission to discuss medical plan with the family. Opportunity was given to ask question and all questions were answered satisfactorily.   Disposition:  Pt is from home, admitted with CHF, and A-fib with RVR, status post IV diuresis, transition to oral torsemide.  Still has A-fib with RVR.  2D echocardiogram is pending.  Cardiology consulted, which precludes a safe discharge. Discharge to home, when stable and cleared by cardiology.  Most likely tomorrow a.m.  Subjective: No significant events overnight, patient still has A-fib with RVR, denies any symptoms, no chest pain or palpitations, shortness of breath is getting better, lower extremity edema almost resolved.   Physical Exam: General: NAD, lying comfortably Appear in no distress, affect appropriate Eyes: PERRLA ENT: Oral Mucosa Clear, moist  Neck: no JVD,  Cardiovascular: Irregular rhythm, no Murmur,  Respiratory: good respiratory effort, Bilateral Air entry equal and Decreased, no Crackles, no wheezes Abdomen: Bowel Sound present, Soft and no tenderness,  Skin: no rashes Extremities: Resolved pedal edema, no calf tenderness Neurologic: without any new focal findings Gait not checked due to patient safety concerns  Vitals:   04/10/23 1044 04/10/23 1300 04/10/23 1411 04/10/23 1603  BP: (!) 157/113 (!) 160/87  (!) 146/103  Pulse: (!) 116 98  (!) 107  Resp:  13  20  Temp:   98.3 F (36.8 C) 98.3 F (36.8 C)  TempSrc:   Oral Oral  SpO2:  96%  95%  Weight:      Height:        Intake/Output Summary (Last 24 hours) at 04/10/2023 1615 Last data filed at 04/10/2023 0802 Gross per 24 hour  Intake 50 ml  Output 4175 ml  Net -4125 ml   Filed Weights   04/08/23 2132  Weight: 88 kg    Data Reviewed: I have personally reviewed and interpreted daily labs, tele strips, imagings as discussed above. I reviewed all nursing notes, pharmacy notes, vitals, pertinent old records I have discussed plan of  care as described above with RN and patient/family.  CBC: Recent Labs  Lab 04/08/23 2124 04/09/23 0543 04/10/23 0505  WBC 9.0 5.5 9.7  NEUTROABS 6.3  --   --   HGB 13.1 11.2* 11.5*  HCT 41.2 33.7* 34.0*  MCV 94.7 92.6 89.9  PLT 123* 112* 137*   Basic Metabolic Panel: Recent Labs  Lab 04/08/23 2124 04/09/23 0543 04/09/23 2139 04/10/23 0505  NA 138 140  --  140  K 4.6 3.1* 3.2* 3.1*  CL 111 106  --  104  CO2 16* 25  --  26  GLUCOSE 262* 143*  --  90  BUN 17 16  --  16  CREATININE 1.19 0.89  --  0.80  CALCIUM 8.4* 8.1*  --  8.3*  MG  --  2.0 1.9 2.4  PHOS  --  3.3  --  2.3*    Studies: No results found.   Scheduled Meds:  apixaban  5 mg Oral BID   atorvastatin  80 mg Oral Daily   buPROPion ER  100 mg Oral q morning   fluticasone furoate-vilanterol  1 puff Inhalation Daily   And   umeclidinium bromide  1 puff Inhalation Daily   furosemide  40 mg Intravenous Q12H   gabapentin  1,200 mg Oral QHS   [  START ON 04/11/2023] losartan  50 mg Oral Daily   metoprolol tartrate  50 mg Oral BID   multivitamin with minerals  1 tablet Oral Daily   nitroGLYCERIN  0.5 inch Topical Once   pantoprazole  40 mg Oral Daily   phosphorus  500 mg Oral QID   potassium chloride  40 mEq Oral Q4H   sertraline  150 mg Oral Daily   spironolactone  12.5 mg Oral Daily   Continuous Infusions:  nitroGLYCERIN Stopped (04/09/23 1347)   PRN Meds: acetaminophen, albuterol, dextromethorphan-guaiFENesin, hydrALAZINE, metoprolol tartrate, ondansetron (ZOFRAN) IV  Time spent: 55 minutes  Author: Gillis Santa. MD [facility] 04/10/2023 4:15 PM  To reach On-call, see care teams to locate the attending and reach out to them via [email or web address]. If 7PM-7AM, please contact night-coverage If you still have difficulty reaching the attending provider, please page the [facility] (Director on Call) for [facility] on amion for assistance.

## 2023-04-11 ENCOUNTER — Encounter: Payer: Self-pay | Admitting: Internal Medicine

## 2023-04-11 ENCOUNTER — Inpatient Hospital Stay: Admitting: Anesthesiology

## 2023-04-11 ENCOUNTER — Encounter
Admission: EM | Disposition: A | Discharge status: Home / Self Care | Payer: Self-pay | Source: Home / Self Care | Attending: Student

## 2023-04-11 DIAGNOSIS — J810 Acute pulmonary edema: Principal | ICD-10-CM

## 2023-04-11 DIAGNOSIS — E876 Hypokalemia: Secondary | ICD-10-CM

## 2023-04-11 DIAGNOSIS — I5023 Acute on chronic systolic (congestive) heart failure: Secondary | ICD-10-CM | POA: Diagnosis not present

## 2023-04-11 DIAGNOSIS — I4891 Unspecified atrial fibrillation: Secondary | ICD-10-CM

## 2023-04-11 DIAGNOSIS — R7989 Other specified abnormal findings of blood chemistry: Secondary | ICD-10-CM

## 2023-04-11 DIAGNOSIS — I4819 Other persistent atrial fibrillation: Secondary | ICD-10-CM

## 2023-04-11 DIAGNOSIS — I509 Heart failure, unspecified: Secondary | ICD-10-CM

## 2023-04-11 DIAGNOSIS — R000 Tachycardia, unspecified: Secondary | ICD-10-CM

## 2023-04-11 DIAGNOSIS — I5043 Acute on chronic combined systolic (congestive) and diastolic (congestive) heart failure: Secondary | ICD-10-CM

## 2023-04-11 DIAGNOSIS — J9600 Acute respiratory failure, unspecified whether with hypoxia or hypercapnia: Secondary | ICD-10-CM

## 2023-04-11 HISTORY — PX: CARDIOVERSION: SHX1299

## 2023-04-11 LAB — CBC
HCT: 42.5 % (ref 39.0–52.0)
Hemoglobin: 14.1 g/dL (ref 13.0–17.0)
MCH: 30.1 pg (ref 26.0–34.0)
MCHC: 33.2 g/dL (ref 30.0–36.0)
MCV: 90.6 fL (ref 80.0–100.0)
Platelets: 159 10*3/uL (ref 150–400)
RBC: 4.69 MIL/uL (ref 4.22–5.81)
RDW: 15.9 % — ABNORMAL HIGH (ref 11.5–15.5)
WBC: 11.1 10*3/uL — ABNORMAL HIGH (ref 4.0–10.5)
nRBC: 0 % (ref 0.0–0.2)

## 2023-04-11 LAB — PHOSPHORUS: Phosphorus: 3.9 mg/dL (ref 2.5–4.6)

## 2023-04-11 LAB — BASIC METABOLIC PANEL WITH GFR
Anion gap: 11 (ref 5–15)
Anion gap: 10 (ref 5–15)
BUN: 16 mg/dL (ref 8–23)
BUN: 18 mg/dL (ref 8–23)
CO2: 28 mmol/L (ref 22–32)
CO2: 25 mmol/L (ref 22–32)
Calcium: 8.7 mg/dL — ABNORMAL LOW (ref 8.9–10.3)
Calcium: 8.6 mg/dL — ABNORMAL LOW (ref 8.9–10.3)
Chloride: 101 mmol/L (ref 98–111)
Chloride: 103 mmol/L (ref 98–111)
Creatinine, Ser: 0.87 mg/dL (ref 0.61–1.24)
Creatinine, Ser: 1 mg/dL (ref 0.61–1.24)
GFR, Estimated: >60 mL/min (ref >60)
GFR, Estimated: >60 mL/min (ref >60)
Glucose, Bld: 92 mg/dL (ref 70–99)
Glucose, Bld: 92 mg/dL (ref 70–99)
Potassium: 3 mmol/L — ABNORMAL LOW (ref 3.5–5.1)
Potassium: 4.4 mmol/L (ref 3.5–5.1)
Sodium: 140 mmol/L (ref 135–145)
Sodium: 138 mmol/L (ref 135–145)

## 2023-04-11 LAB — MAGNESIUM: Magnesium: 2.3 mg/dL (ref 1.7–2.4)

## 2023-04-11 SURGERY — CARDIOVERSION
Anesthesia: General

## 2023-04-11 MED ORDER — LIDOCAINE HCL (CARDIAC) PF 100 MG/5ML IV SOSY
PREFILLED_SYRINGE | INTRAVENOUS | Status: DC | PRN
  Administered 2023-04-11: 50 mg via INTRAVENOUS

## 2023-04-11 MED ORDER — EPHEDRINE 5 MG/ML INJ
INTRAVENOUS | Status: AC
  Filled 2023-04-11: qty 5

## 2023-04-11 MED ORDER — PROPOFOL 500 MG/50ML IV EMUL
INTRAVENOUS | Status: DC | PRN
Start: 2023-04-11 — End: 2023-04-11
  Administered 2023-04-11: 50 mg via INTRAVENOUS
  Administered 2023-04-11: 30 mg via INTRAVENOUS

## 2023-04-11 MED ORDER — LOSARTAN POTASSIUM 50 MG PO TABS
50.0000 mg | ORAL_TABLET | Freq: Once | ORAL | Status: AC
  Administered 2023-04-11: 50 mg via ORAL
  Filled 2023-04-11: qty 1

## 2023-04-11 MED ORDER — POTASSIUM CHLORIDE 10 MEQ/100ML IV SOLN
10.0000 meq | INTRAVENOUS | Status: AC
  Administered 2023-04-11 (×4): 10 meq via INTRAVENOUS
  Filled 2023-04-11 (×4): qty 100

## 2023-04-11 MED ORDER — POTASSIUM CHLORIDE CRYS ER 20 MEQ PO TBCR
40.0000 meq | EXTENDED_RELEASE_TABLET | ORAL | Status: AC
  Administered 2023-04-11 (×2): 40 meq via ORAL
  Filled 2023-04-11 (×2): qty 2

## 2023-04-11 MED ORDER — LOSARTAN POTASSIUM 50 MG PO TABS
100.0000 mg | ORAL_TABLET | Freq: Every day | ORAL | Status: DC
  Administered 2023-04-12: 100 mg via ORAL
  Filled 2023-04-11: qty 2

## 2023-04-11 MED ORDER — PHENYLEPHRINE 80 MCG/ML (10ML) SYRINGE FOR IV PUSH (FOR BLOOD PRESSURE SUPPORT)
PREFILLED_SYRINGE | INTRAVENOUS | Status: AC
  Filled 2023-04-11: qty 10

## 2023-04-11 MED ORDER — CEFAZOLIN SODIUM-DEXTROSE 1-4 GM/50ML-% IV SOLN: 1.0000 g | Freq: Once | INTRAVENOUS | Status: DC

## 2023-04-11 MED ORDER — LIDOCAINE HCL (PF) 2 % IJ SOLN
INTRAMUSCULAR | Status: AC
Start: 2023-04-11 — End: ?
  Filled 2023-04-11: qty 5

## 2023-04-11 MED ORDER — PROPOFOL 10 MG/ML IV BOLUS
INTRAVENOUS | Status: AC
  Filled 2023-04-11: qty 40

## 2023-04-11 NOTE — Progress Notes (Signed)
 Rounding Note    Patient Name: Jordan [name] Date of Encounter: 04/11/2023  [facility] Cardiologist: new to [place]-[facility] End  Subjective   Underwent successful cardioversion this morning Immediately postprocedure with tachycardia rate 100 which has slowly improved through the morning Did not receive morning medications prior to cardioversion Continued mild shortness of breath Blood pressure running high this morning 150-180 systolic Yesterday predominant 150 systolic  Inpatient Medications    Scheduled Meds:  apixaban  5 mg Oral BID   atorvastatin  80 mg Oral Daily   buPROPion ER  100 mg Oral q morning   fluticasone furoate-vilanterol  1 puff Inhalation Daily   And   umeclidinium bromide  1 puff Inhalation Daily   gabapentin  1,200 mg Oral QHS   [START ON 04/12/2023] losartan  100 mg Oral Daily   metoprolol tartrate  50 mg Oral BID   multivitamin with minerals  1 tablet Oral Daily   pantoprazole  40 mg Oral Daily   potassium chloride  40 mEq Oral Q4H   sertraline  150 mg Oral Daily   spironolactone  12.5 mg Oral Daily   Continuous Infusions:  potassium chloride 10 mEq (04/11/23 1327)   PRN Meds: acetaminophen, albuterol, dextromethorphan-guaiFENesin, hydrALAZINE, metoprolol tartrate, ondansetron (ZOFRAN) IV   Vital Signs    Vitals:   04/11/23 0845 04/11/23 0900 04/11/23 0920 04/11/23 1117  BP: (!) 148/66 (!) 141/78 (!) 151/66 (!) 141/63  Pulse: 82 75 62 66  Resp: 16 17 17 18   Temp:  97.9 F (36.6 C) 98 F (36.7 C) 98.1 F (36.7 C)  TempSrc:  Oral  Oral  SpO2: 92% 94% 93% 93%  Weight:      Height:        Intake/Output Summary (Last 24 hours) at 04/11/2023 1355 Last data filed at 04/11/2023 0421 Gross per 24 hour  Intake --  Output 2150 ml  Net -2150 ml      04/11/2023    5:00 AM 04/08/2023    9:32 PM 11/16/2022    4:37 PM  Last 3 Weights  Weight (lbs) 176 lb 5.9 oz 194 lb 194 lb  Weight (kg) 80 kg 87.998 kg 87.998 kg      Telemetry     Normal sinus rhythm- Personally Reviewed  ECG     - Personally Reviewed  Physical Exam   GEN: No acute distress.   Neck: No JVD Cardiac: RRR, no murmurs, rubs, or gallops.  Respiratory: Clear to auscultation bilaterally. GI: Soft, nontender, non-distended  MS: No edema; No deformity. Neuro:  Nonfocal  Psych: Normal affect   Labs    High Sensitivity Troponin:   Recent Labs  Lab 04/08/23 2124 04/08/23 2326  TROPONINIHS 35* 32*     Chemistry Recent Labs  Lab 04/09/23 0543 04/09/23 2139 04/10/23 0505 04/11/23 0416  NA 140  --  140 140  K 3.1* 3.2* 3.1* 3.0*  CL 106  --  104 101  CO2 25  --  26 28  GLUCOSE 143*  --  90 92  BUN 16  --  16 16  CREATININE 0.89  --  0.80 0.87  CALCIUM 8.1*  --  8.3* 8.7*  MG 2.0 1.9 2.4 2.3  GFRNONAA >60  --  >60 >60  ANIONGAP 9  --  10 11    Lipids No results for input(s): "CHOL", "TRIG", "HDL", "LABVLDL", "LDLCALC", "CHOLHDL" in the last 168 hours.  Hematology Recent Labs  Lab 04/09/23 0543 04/10/23 0505  04/11/23 0416  WBC 5.5 9.7 11.1*  RBC 3.64* 3.78* 4.69  HGB 11.2* 11.5* 14.1  HCT 33.7* 34.0* 42.5  MCV 92.6 89.9 90.6  MCH 30.8 30.4 30.1  MCHC 33.2 33.8 33.2  RDW 16.2* 16.0* 15.9*  PLT 112* 137* 159   Thyroid  Recent Labs  Lab 04/10/23 0504  TSH 3.899    BNP Recent Labs  Lab 04/08/23 2124  BNP 4,363.7*    DDimer No results for input(s): "DDIMER" in the last 168 hours.   Radiology    No results found.  Cardiac Studies   Echo Left ventricular ejection fraction, by estimation, is 40 to 45%. The  left ventricle has mildly decreased function. The left ventricle  demonstrates global hypokinesis. Left ventricular diastolic parameters are  consistent with Grade I diastolic  dysfunction (impaired relaxation).   2. Right ventricular systolic function is normal. The right ventricular  size is normal. There is moderately elevated pulmonary artery systolic  pressure.   3. Left atrial size was severely  dilated.   4. Right atrial size was severely dilated.   5. The mitral valve is normal in structure. Moderate mitral valve  regurgitation. No evidence of mitral stenosis.   6. Tricuspid valve regurgitation is moderate.   7. The aortic valve is normal in structure. Aortic valve regurgitation is  mild. No aortic stenosis is present.   8. The inferior vena cava is normal in size with greater than 50%  respiratory variability, suggesting right atrial pressure of 3 mmHg.   Patient Profile     Jordan [name] is a 78 y.o. male with a hx of HFrEF, paroxysmal A-fib on Eliquis, hypertension, hyperlipidemia, COPD, depression with anxiety, BPH, Parkinson's disease, bronchiectasis, MS, thrombocytopenia who is being seen 04/10/2023 for the evaluation of A-fib and acute on chronic HFrEF   Assessment & Plan    Acute on chronic diastolic and systolic CHF with flash pulmonary edema Respiratory distress April 6, lower extremity edema Known cardiomyopathy ejection fraction 30 to 35% at [facility] August 2024 This admission with EF estimated 40 to 45% global hypokinesis moderately elevated right heart pressures severe dilation right and left atria moderate MR BNP over 4000 Initially started on 6 L nasal cannula oxygen, treated with Lasix IV 40 twice daily Losartan, metoprolol tartrate, spironolactone added -Cardioversion this morning  Persistent atrial fibrillation Timing of A-fib unclear, reports he has been maintaining normal sinus rhythm at home and only recently developing atrial fibrillation per his recording devices -Cardioversion this morning, normal sinus rhythm restored - Will continue metoprolol tartrate 50 twice daily, Eliquis 5 twice daily - For recurrent arrhythmia  may need to consider amiodarone  Elevated troponin Cardiac [facility] October 2024 showing nonobstructive disease Ejection fraction 40 to 45% with mild global hypokinesis Minimally elevated troponin this admission in the 30s No plans for  ischemic workup at this time  Essential hypertension Possible trigger for his heart failure and atrial fibrillation Markedly elevated pressures on arrival, again this morning prior to cardioversion - Losartan up to 100 daily, metoprolol tartrate 50 twice daily, spironolactone 12.5 daily - Consider repeat dose Lasix tomorrow once potassium has been repleted  Hypokalemia Aggressive repletion of potassium oral and IV    For questions or updates, please contact [facility] Please consult [email or web address] for contact info under        Signed, Julien Nordmann, MD  04/11/2023, 1:55 PM

## 2023-04-11 NOTE — Progress Notes (Signed)
 Heart Failure Nurse Navigator Progress Note  PCP: Damaris Schooner, DO PCP-Cardiologist: Dr. Ardeth Sportsman, MD Kathrin Greathouse, MD-[facility] Admission Diagnosis: Flash Pulmonary edema [facility]) Acute congestive heart failure, unspecified heart failure (HCC) Acute respiratory failure, unspecified whether with hypoxia or hypercapnia [facility])  Admitted from: Home  Presentation:   Jordan [name] presented with shortness of breath.  Had an intermittent dry cough.  Denied fever, chills, congestion, chest pain, abdominal pain, nausea, vomiting.  They had also noted swelling to his lower extremities recently which is new. BNP 4,363.7. Troponin- 35. Chest X-Ray-Cardiomegaly with mild central congestion and possible small left effusion.  ECHO/ LVEF: 40-45%  Clinical Course:  Past Medical History:  Diagnosis Date   Anemia 2018   Anxiety    COPD (chronic obstructive pulmonary disease) (HCC)    Depression    GERD (gastroesophageal reflux disease)    History of kidney stones    Hypertension    Kidney stone    MS (multiple sclerosis) (HCC)    Restless leg      Social History   Socioeconomic History   Marital status: Married    Spouse name: [name]   Number of children: 1   Years of education: Not on file   Highest education level: Some college, no degree  Occupational History   Not on file  Tobacco Use   Smoking status: Former   Smokeless tobacco: Never  Vaping Use   Vaping status: Never Used  Substance and Sexual Activity   Alcohol use: Yes    Comment: 1 drink bouban with dinner per week   Drug use: Never   Sexual activity: Not Currently  Other Topics Concern   Not on file  Social History Narrative   Not on file   Social Drivers of Health   Financial Resource Strain: Low Risk  (04/11/2023)   Overall Financial Resource Strain (CARDIA)    Difficulty of Paying Living Expenses: Not hard at all  Food Insecurity: No Food Insecurity (04/11/2023)    Hunger Vital Sign    Worried About Running Out of Food in the Last Year: Never true    Ran Out of Food in the Last Year: Never true  Transportation Needs: No Transportation Needs (04/11/2023)   PRAPARE - Administrator, Civil Service (Medical): No    Lack of Transportation (Non-Medical): No  Physical Activity: Insufficiently Active (04/30/2018)   [facility] from [facility], [facility]   Exercise Vital Sign    Days of Exercise per Week: 5 days    Minutes of Exercise per Session: 10 min  Stress: No Stress Concern Present (04/30/2018)   [facility] from [facility], [facility]   [employer] of Occupational Health - Occupational Stress Questionnaire    Feeling of Stress : Not at all  Social Connections: Socially Integrated (04/10/2023)   Social Connection and Isolation Panel [NHANES]    Frequency of Communication with Friends and Family: More than three times a week    Frequency of Social Gatherings with Friends and Family: More than three times a week    Attends Religious Services: 1 to 4 times per year    Active Member of [employer] or Organizations: Yes    Attends Engineer, structural: More than 4 times per year    Marital Status: Married   Water engineer and Provision:  Detailed education and instructions provided on heart failure disease management including the following:  Signs and symptoms of Heart Failure When to call the physician Importance of daily weights Low sodium diet Fluid restriction Medication management Anticipated future follow-up appointments  Patient education given on each of the above topics.  Patient acknowledges understanding via teach back method and acceptance of all instructions.  Education Materials:  "Living Better With Heart Failure" Booklet, HF zone tool, & Daily Weight Tracker Tool.  Patient has scale at home: Yes Patient has pill box at home: Yes.  Uses  the "Hero" pill delivery system for medication administration  High Risk Criteria for Readmission and/or Poor Patient Outcomes: Heart failure hospital admissions (last 6 months): 1  No Show rate: 0 Difficult social situation: None Demonstrates medication adherence: Yes Primary Language: English Literacy level: Reading, Writing & Comprehension  Barriers of Care:   None  Considerations/Referrals:   Referral made to Heart Failure Pharmacist Stewardship: Yes Referral made to Heart Failure CSW/NCM TOC: No Referral made to Heart & Vascular TOC clinic: No.  Patient has a follow-up with Dr. Vito Backers @ [facility] 05/01/23. Encouraged patient and wife to either see Dr.Gollan within a week of hospital discharge or Dr. Julio Alm. Patient in agreement with plan for a 1 week post hospital follow-up since he prefers to remain with [facility].  Items for Follow-up on DC/TOC: Daily weights-encouraged him to start doing these Diet & Fluid Restrictions-stays within the 64 oz per day Continued Heart Failure Education  Roxy Horseman, RN, BSN [facility] Heart Failure Navigator Secure Chat Only

## 2023-04-11 NOTE — Anesthesia Postprocedure Evaluation (Signed)
 Anesthesia Post Note  Patient: Jordan [name]  Procedure(s) Performed: CARDIOVERSION  Patient location during evaluation: Specials Recovery Anesthesia Type: General Level of consciousness: awake and alert Pain management: pain level controlled Vital Signs Assessment: post-procedure vital signs reviewed and stable Respiratory status: spontaneous breathing, nonlabored ventilation, respiratory function stable and patient connected to nasal cannula oxygen Cardiovascular status: blood pressure returned to baseline and stable Postop Assessment: no apparent nausea or vomiting Anesthetic complications: no   No notable events documented.   Last Vitals:  Vitals:   04/11/23 0845 04/11/23 0900  BP: (!) 148/66 (!) 141/78  Pulse: 82 75  Resp: 16 17  Temp:    SpO2: 92% 94%    Last Pain:  Vitals:   04/11/23 0900  TempSrc:   PainSc: 0-No pain                 Corinda Gubler

## 2023-04-11 NOTE — Plan of Care (Signed)

## 2023-04-11 NOTE — Plan of Care (Signed)

## 2023-04-11 NOTE — CV Procedure (Signed)
Cardioversion procedure note ?For atrial fibrillation with RVR ? ?Procedure Details: ? ?Consent: Risks of procedure as well as the alternatives and risks of each were explained to the (patient/caregiver).  Consent for procedure obtained. ? ?Time Out: Verified patient identification, verified procedure, site/side was marked, verified correct patient position, special equipment/implants available, medications/allergies/relevent history reviewed, required imaging and test results available.  Performed ? ?Patient placed on cardiac monitor, pulse oximetry, supplemental oxygen as necessary.   ?Sedation given: propofol IV, Dr. Bertell Maria ?Pacer pads placed anterior and posterior chest. ? ? ?Cardioverted 1 time(s).   ?Cardioverted at 150 J. Synchronized biphasic ?Converted to NSR ? ? ?Evaluation: ?Findings: Post procedure EKG shows: NSR ?Complications: None ?Patient did tolerate procedure well. ? ?Time Spent Directly with the Patient: ? ?45 minutes  ? ?Esmond Plants, M.D., Ph.D. ? ?

## 2023-04-11 NOTE — Care Management Important Message (Signed)
 Important Message  Patient Details  Name: Jordan [name] MRN: [id number] Date of Birth: September 18, 1945   Important Message Given:  Yes - Medicare IM     Marcell Anger 04/11/2023, 4:10 PM

## 2023-04-11 NOTE — Transfer of Care (Signed)
 Immediate Anesthesia Transfer of Care Note  Patient: Jordan [name]  Procedure(s) Performed: CARDIOVERSION  Patient Location: PACU  Anesthesia Type:MAC  Level of Consciousness: sedated  Airway & Oxygen Therapy: Patient Spontanous Breathing and Patient connected to nasal cannula oxygen  Post-op Assessment: Report given to RN and Post -op Vital signs reviewed and stable  Post vital signs: stable  Last Vitals:  Vitals Value Taken Time  BP 125/77   Temp    Pulse 64   Resp 16   SpO2 95     Last Pain:  Vitals:   04/11/23 0721  TempSrc: Oral  PainSc: 0-No pain         Complications: No notable events documented.

## 2023-04-11 NOTE — Progress Notes (Signed)
 [facility] Progress Note  Patient: Jordan [name]    XBJ:[id number]  DOA: 04/08/2023     Date of Service: the patient was seen and examined on 04/11/2023  Chief Complaint  Patient presents with   Respiratory Distress   Brief hospital course:  Jordan [name] is a 78 y.o. male with medical history significant of sCHF with EF30-35%,  A fib on Eliquis, HTN, HLD, COPD, depression with anxiety, BPH, Parkinson's disease, bronchiectasis, kidney stone, multiple sclerosis, thrombocytopenia, who presents with SOB and leg edema.   Patient states that he has shortness breath in the past several days, which has been progressively worsening.  Patient does not have cough and chest pain.  No fever or chills.  No nausea, vomiting, diarrhea or abdominal pain.  No symptoms of UTI.  Patient has worsening bilateral leg edema recently.  Patient is taking Eliquis consistently.   Per ED physician, patient does not have oxygen desaturation, but BiPAP is started in ED for acute CHF/pulmonary edema.  Nitroglycerin drip is started in ED.   Data reviewed independently and ED Course: pt was found to have BNP 4363, GFR> 60, troponin 35, temperature normal, blood pressure 181/117, heart rate 114, 73, RR 38, 18.  Chest x-ray showed cardiomegaly, vascular congestion and small left pleural effusion.  Patient is admitted to PCU as inpatient.     EKG: I have personally reviewed.  A-fib, QTc 487, poor R wave progression, heart rate 108.    Assessment and Plan:  Acute on chronic systolic CHF (congestive heart failure) [facility]): Patient has SOB, significantly elevated BNP 4363, bilateral 2+ leg edema, positive JVD, crackles on auscultation, vascular congestion on chest x-ray, clinically consistent with CHF exacerbation.  2D echo on 08/23/2022 showed EF 30-35%. -s/p Wean off BiPAP -s/p Wean off nitroglycerin drip S/p Lasix 40 mg IV bid, d/c'd on 4/8 -Continue home spironolactone 4/9 s/p losartan 50 mg x 1 dose, followed by 100 mg  p.o. daily from 4/10  -2d echo -Daily weights -strict I/O's -Low salt diet -Fluid restriction -As needed bronchodilators for shortness of breath Cardiology consulted  Atrial fibrillation, chronic, with RVR - Continue Eliquis Started metoprolol 50 mg p.o. twice daily - As needed IV metoprolol 2.5 mg every 2 hour for heart rate> 125. Cardiology consulted is status post DCCV cardioversion Continue to monitor on telemetry   Hypokalemia secondary to diuresis Potassium repleted. Monitor electrolytes   Myocardial injury: trop 35. No CP.  Likely demand ischemia. Continue Lipitor and Eliquis - TTE LVEF 40 to 45%, grade 1 diastolic dysfunction, LA and RA severely dilated, moderate MR, moderate TR Cardiology following as above     Bronchiectasis without complication (HCC) and COPD (chronic obstructive pulmonary disease) (HCC): - Bronchodilators and as needed Mucinex   Essential hypertension -IV hydralazine as needed - Discontinued amlodipine Continue Aldactone 12.5 mg p.o. daily 4/8 Started metoprolol 50 mg p.o. twice daily 4/9 increase losartan 100 mg p.o. daily   HLD (hyperlipidemia): on Lipitor 80 mg p.o. daily   Thrombocytopenia: This is chronic issue.  Platelet 123.  No active bleeding. -Follow-up with CBC   Multiple sclerosis (HCC): Stable.  Patient is not taking medications currently. -Fall precaution   Benign prostatic hyperplasia without lower urinary tract symptoms -Continue oxybutynin   Depression with anxiety -Continue home Xanax, Wellbutrin, Zoloft, Topamax   Overweight (BMI 25.0-29.9): Body weight 88 kg, BMI 26.31 - Encourage losing weight - Exercise and healthy diet   Parkinson's disease Patient does not take Sinemet as per med  rec    Body mass index is 23.92 kg/m.  Interventions:  Diet: Heart healthy diet DVT Prophylaxis: Therapeutic Anticoagulation with Eliquis    Advance goals of care discussion: DNR/DNI  CODE STATUS discussed on 4/8, patient  would like to be DNR/DNI.  Family Communication: family was present at bedside, at the time of interview.  The pt provided permission to discuss medical plan with the family. Opportunity was given to ask question and all questions were answered satisfactorily.   Disposition:  Pt is from home, admitted with CHF, and A-fib with RVR, s/p IV diuresis, and s/p cardioversion done on 4/9, which precludes a safe discharge. Discharge to home, when stable and cleared by cardiology.  Most likely tomorrow a.m.  Subjective: No significant events overnight, patient was seen after cardioversion, tolerated procedure well.  Denied any chest pain or palpitation, no shortness of breath.  Physical Exam: General: NAD, lying comfortably Appear in no distress, affect appropriate Eyes: PERRLA ENT: Oral Mucosa Clear, moist  Neck: no JVD,  Cardiovascular: S1-S2 audible, no Murmur,  Respiratory: good respiratory effort, Bilateral Air entry equal and Decreased, no Crackles, no wheezes Abdomen: Bowel Sound present, Soft and no tenderness,  Skin: no rashes Extremities: Resolved pedal edema, no calf tenderness Neurologic: without any new focal findings Gait not checked due to patient safety concerns  Vitals:   04/11/23 0900 04/11/23 0920 04/11/23 1117 04/11/23 1506  BP: (!) 141/78 (!) 151/66 (!) 141/63 (!) 148/95  Pulse: 75 62 66 95  Resp: 17 17 18 19   Temp: 97.9 F (36.6 C) 98 F (36.7 C) 98.1 F (36.7 C) 97.9 F (36.6 C)  TempSrc: Oral  Oral   SpO2: 94% 93% 93% 94%  Weight:      Height:        Intake/Output Summary (Last 24 hours) at 04/11/2023 1523 Last data filed at 04/11/2023 1356 Gross per 24 hour  Intake 714.66 ml  Output 2150 ml  Net -1435.34 ml   Filed Weights   04/08/23 2132 04/11/23 0500  Weight: 88 kg 80 kg    Data Reviewed: I have personally reviewed and interpreted daily labs, tele strips, imagings as discussed above. I reviewed all nursing notes, pharmacy notes, vitals, pertinent  old records I have discussed plan of care as described above with RN and patient/family.  CBC: Recent Labs  Lab 04/08/23 2124 04/09/23 0543 04/10/23 0505 04/11/23 0416  WBC 9.0 5.5 9.7 11.1*  NEUTROABS 6.3  --   --   --   HGB 13.1 11.2* 11.5* 14.1  HCT 41.2 33.7* 34.0* 42.5  MCV 94.7 92.6 89.9 90.6  PLT 123* 112* 137* 159   Basic Metabolic Panel: Recent Labs  Lab 04/08/23 2124 04/09/23 0543 04/09/23 2139 04/10/23 0505 04/11/23 0416  NA 138 140  --  140 140  K 4.6 3.1* 3.2* 3.1* 3.0*  CL 111 106  --  104 101  CO2 16* 25  --  26 28  GLUCOSE 262* 143*  --  90 92  BUN 17 16  --  16 16  CREATININE 1.19 0.89  --  0.80 0.87  CALCIUM 8.4* 8.1*  --  8.3* 8.7*  MG  --  2.0 1.9 2.4 2.3  PHOS  --  3.3  --  2.3* 3.9    Studies: No results found.   Scheduled Meds:  apixaban  5 mg Oral BID   atorvastatin  80 mg Oral Daily   buPROPion ER  100 mg Oral q morning  fluticasone furoate-vilanterol  1 puff Inhalation Daily   And   umeclidinium bromide  1 puff Inhalation Daily   gabapentin  1,200 mg Oral QHS   [START ON 04/12/2023] losartan  100 mg Oral Daily   metoprolol tartrate  50 mg Oral BID   multivitamin with minerals  1 tablet Oral Daily   pantoprazole  40 mg Oral Daily   potassium chloride  40 mEq Oral Q4H   sertraline  150 mg Oral Daily   spironolactone  12.5 mg Oral Daily   Continuous Infusions:   PRN Meds: acetaminophen, albuterol, dextromethorphan-guaiFENesin, hydrALAZINE, metoprolol tartrate, ondansetron (ZOFRAN) IV  Time spent: 55  minutes  Author: Gillis Santa. MD [facility] 04/11/2023 3:23 PM  To reach On-call, see care teams to locate the attending and reach out to them via [email or web address]. If 7PM-7AM, please contact night-coverage If you still have difficulty reaching the attending provider, please page the [facility] (Director on Call) for [facility] on amion for assistance.

## 2023-04-11 NOTE — Progress Notes (Incomplete)
 Heart Failure Stewardship Pharmacy Note  PCP: Damaris Schooner, DO PCP-Cardiologist: None  HPI: Jordan [name] is a 78 y.o. male with sCHF,  A fib on Eliquis, HTN, HLD, COPD, depression with anxiety, BPH, Parkinson's disease, bronchiectasis, kidney stone, multiple sclerosis, thrombocytopenia who presented with shortness of breath and lower extremity edema. On admission, BNP was 4363.7, HS-troponin was 32, and TSH was 3.899. Chest x-ray noted cardiomegaly with mild central congestion and possible small left effusion. Presented severely hypertensive in AF with RVR. XXxas   Pertinent cardiac history:  Pertinent Lab Values: Creatinine, Ser  Date Value Ref Range Status  04/11/2023 0.87 0.61 - 1.24 mg/dL Final   BUN  Date Value Ref Range Status  04/11/2023 16 8 - 23 mg/dL Final   Potassium  Date Value Ref Range Status  04/11/2023 3.0 (L) 3.5 - 5.1 mmol/L Final   Sodium  Date Value Ref Range Status  04/11/2023 140 135 - 145 mmol/L Final   B Natriuretic Peptide  Date Value Ref Range Status  04/08/2023 4,363.7 (H) 0.0 - 100.0 pg/mL Final    Comment:    Performed at [facility], [address] [place], [place] [address]   Magnesium  Date Value Ref Range Status  04/11/2023 2.3 1.7 - 2.4 mg/dL Final    Comment:    Performed at [facility], [address] [place], [place] [address]   TSH  Date Value Ref Range Status  04/10/2023 3.899 0.350 - 4.500 uIU/mL Final    Comment:    Performed by a 3rd Generation assay with a functional sensitivity of <=0.01 uIU/mL. Performed at [facility], [address] [place], [place] [address]     Vital Signs: Admission weight: Temp:  [97.6 F (36.4 C)-98.7 F (37.1 C)] 98.7 F (37.1 C) (04/09 0820) Pulse Rate:  [58-116] 90 (04/09 0830) Cardiac Rhythm: Normal sinus rhythm (04/09 0830) Resp:  [13-20] 16 (04/09 0830) BP: (122-178)/(69-113) 139/88 (04/09 0830) SpO2:  [92 %-96 %] 95 % (04/09 0830) Weight:  [80 kg  (176 lb 5.9 oz)] 80 kg (176 lb 5.9 oz) (04/09 0500)  Intake/Output Summary (Last 24 hours) at 04/11/2023 0834 Last data filed at 04/11/2023 0421 Gross per 24 hour  Intake --  Output 2150 ml  Net -2150 ml    Current Heart Failure Medications:  Loop diuretic: Beta-Blocker: ACEI/ARB/ARNI: MRA: SGLT2i: Other:  Prior to admission Heart Failure Medications:  Loop diuretic: Beta-Blocker: ACEI/ARB/ARNI: MRA: SGLT2i: Other:  Assessment: 1. {CHL AMB Acute or Chronic:210917265}  - Plan: 1) Medication changes recommended at this time:  2) Patient assistance:   3) Education: -To be completed prior to discharge.  *** Medication Assistance / Insurance Benefits Check: Does the patient have prescription insurance?    Type of insurance plan:  Does the patient qualify for medication assistance through manufacturers or grants? {CHL AMB Yes/No/Pending:210917269}  Eligible grants and/or patient assistance programs: ***  Medication assistance applications in progress: ***  Medication assistance applications approved: *** Approved medication assistance renewals will be completed by: ***  Outpatient Pharmacy: Prior to admission outpatient pharmacy: ***      ***

## 2023-04-11 NOTE — Anesthesia Preprocedure Evaluation (Addendum)
 Anesthesia Evaluation  Patient identified by MRN, date of birth, ID band Patient awake    Reviewed: Allergy & Precautions, NPO status , Patient's Chart, lab work & pertinent test results  History of Anesthesia Complications Negative for: history of anesthetic complications  Airway Mallampati: III  TM Distance: >3 FB Neck ROM: Full    Dental  (+) Chipped   Pulmonary asthma , neg sleep apnea, COPD, Patient abstained from smoking.Not current smoker, former smoker    + decreased breath sounds      Cardiovascular Exercise Tolerance: Poor METShypertension, +CHF  (-) CAD and (-) Past MI + dysrhythmias Atrial Fibrillation  Rhythm:Irregular Rate:Tachycardia - Systolic murmurs Per cardiologist recent note: Echo shows LVEF 40-45% with moderate MR and TR as well as severe biatrial enlargement.   In summary, Jordan [name] is a 78 y/o man admitted with acute on chronic HFrEF in the setting of a-fib with RVR.  His breathing has improved with diuresis and he appears only mildly volume overloaded on exam.  I suspect that going into a-fib may have precipitated his heart failure exacerbation.  Ventricular rates are marginal at this time.  I think he would benefit from restoration of sinus rhythm and have recommended moving forward with DCCV as soon as tomorrow if his breathing allows and he does not convert to sinus rhythm in the interim.  TEE is not needed, as he reports uninterrupted anticoagulation for at least 4 weeks.  We will continue his current doses of furosemide metoprolol, losartan, and spironolactone though consideration should be given to transitioning to an evidence beta-blocker prior to discharge.  Jordan [name] would benefit from EP consultation as an outpatient to discuss long-term rhythm control options, as worry that he is likely to revert to a-fib in the future with his cardiomyopathy and biatrial enlargement.  R/LHC will also need to be considered in  the future, though I favor deferring this until Jordan [name] has completed 4 weeks of anticoagulation following DCCV.  Minimal troponin elevation is consistent with supply-demand mismatch in the setting of decompensated heart failure and atrial fibrillation with rapid ventricular response.    Neuro/Psych  PSYCHIATRIC DISORDERS Anxiety Depression    negative neurological ROS     GI/Hepatic ,GERD  ,,(+)     (-) substance abuse    Endo/Other  neg diabetes    Renal/GU negative Renal ROS     Musculoskeletal   Abdominal   Peds  Hematology   Anesthesia Other Findings Past Medical History: 2018: Anemia No date: Anxiety No date: COPD (chronic obstructive pulmonary disease) (HCC) No date: Depression No date: GERD (gastroesophageal reflux disease) No date: History of kidney stones No date: Hypertension No date: Kidney stone No date: MS (multiple sclerosis) (HCC) No date: Restless leg  Reproductive/Obstetrics                             Anesthesia Physical Anesthesia Plan  ASA: 3  Anesthesia Plan: General   Post-op Pain Management: Minimal or no pain anticipated   Induction: Intravenous  PONV Risk Score and Plan: 2 and Propofol infusion, TIVA and Ondansetron  Airway Management Planned: Nasal Cannula  Additional Equipment: None  Intra-op Plan:   Post-operative Plan:   Informed Consent: I have reviewed the patients History and Physical, chart, labs and discussed the procedure including the risks, benefits and alternatives for the proposed anesthesia with the patient or authorized representative who has indicated his/her understanding and acceptance.  Dental advisory given  Plan Discussed with: CRNA and Surgeon  Anesthesia Plan Comments: (Discussed risks of anesthesia with patient, including possibility of difficulty with spontaneous ventilation under anesthesia necessitating airway intervention, PONV, and rare risks such as cardiac or  respiratory or neurological events, and allergic reactions. Discussed the role of CRNA in patient's perioperative care. Patient understands.)       Anesthesia Quick Evaluation

## 2023-04-11 NOTE — Evaluation (Signed)
 Physical Therapy Evaluation Patient Details Name: Jordan [name] MRN: [id number] DOB: Jun 05, 1945 Today's Date: 04/11/2023  History of Present Illness  Pt admitted for acute on chronic CHF with complaints of Afib. History includes heart failure, Afib, HTN, HLD, COPD, depression. Pt is s/p cardioversion this date.  Clinical Impression  Pt is a pleasant 78 year old male who was admitted for acute on chronic CHF with complaints of Afib and s/p cardioversion this AM. Pt performs bed mobility with indep, transfers with mod I, and ambulation with cga and no AD. Pt demonstrates all bed mobility/transfers/ambulation at baseline level. Pt does not require any further PT needs at this time. Pt will be dc in house and does not require follow up. RN aware. Will dc current orders. Would recommend continue to mobilize with mobility services while admitted.      If plan is discharge home, recommend the following: A little help with walking and/or transfers   Can travel by private vehicle        Equipment Recommendations None recommended by PT  Recommendations for Other Services       Functional Status Assessment Patient has not had a recent decline in their functional status     Precautions / Restrictions Precautions Precautions: Fall Recall of Precautions/Restrictions: Intact Restrictions Weight Bearing Restrictions Per Provider Order: No      Mobility  Bed Mobility Overal bed mobility: Independent             General bed mobility comments: safe technique    Transfers Overall transfer level: Modified independent Equipment used: None               General transfer comment: upright posture    Ambulation/Gait Ambulation/Gait assistance: Contact guard assist Gait Distance (Feet): 200 Feet Assistive device: None Gait Pattern/deviations: Step-through pattern       General Gait Details: slight increased postural sway noted with ambulation, however no formal LOB noted. Able to  carry conversation with exertion. HR at 105bpm pre and elevated to 108bpm with gait training. Pt reports no symptoms.  Stairs            Wheelchair Mobility     Tilt Bed    Modified Rankin (Stroke Patients Only)       Balance Overall balance assessment: Mild deficits observed, not formally tested                                           Pertinent Vitals/Pain Pain Assessment Pain Assessment: No/denies pain    Home Living Family/patient expects to be discharged to:: Private residence Living Arrangements: Spouse/significant other Available Help at Discharge: Family Type of Home: House Home Access: Level entry       Home Layout: One level Home Equipment:  (walking stick and hemi walker)      Prior Function Prior Level of Function : Independent/Modified Independent;History of Falls (last six months)             Mobility Comments: very active and indep. No device use in home and uses walking stick in community. ADLs Comments: indep     Extremity/Trunk Assessment   Upper Extremity Assessment Upper Extremity Assessment: Overall WFL for tasks assessed    Lower Extremity Assessment Lower Extremity Assessment: Overall WFL for tasks assessed       Communication   Communication Communication: No apparent difficulties    Cognition Arousal:  Alert Behavior During Therapy: WFL for tasks assessed/performed   PT - Cognitive impairments: No apparent impairments                       PT - Cognition Comments: agreeable to session Following commands: Intact       Cueing Cueing Techniques: Verbal cues     General Comments      Exercises     Assessment/Plan    PT Assessment Patient does not need any further PT services  PT Problem List         PT Treatment Interventions      PT Goals (Current goals can be found in the Care Plan section)  Acute Rehab PT Goals Patient Stated Goal: to go home PT Goal Formulation: All  assessment and education complete, DC therapy Time For Goal Achievement: 04/11/23 Potential to Achieve Goals: Good    Frequency       Co-evaluation               AM-PAC PT "6 Clicks" Mobility  Outcome Measure Help needed turning from your back to your side while in a flat bed without using bedrails?: None Help needed moving from lying on your back to sitting on the side of a flat bed without using bedrails?: None Help needed moving to and from a bed to a chair (including a wheelchair)?: None Help needed standing up from a chair using your arms (e.g., wheelchair or bedside chair)?: None Help needed to walk in hospital room?: None Help needed climbing 3-5 steps with a railing? : A Little 6 Click Score: 23    End of Session   Activity Tolerance: Patient tolerated treatment well Patient left: in bed (seated at EOB, wife present and asking to give patient a bath- CNA called to bring supplies) Nurse Communication: Mobility status PT Visit Diagnosis: Unsteadiness on feet (R26.81)    Time: 1610-9604 PT Time Calculation (min) (ACUTE ONLY): 15 min   Charges:   PT Evaluation $PT Eval Low Complexity: 1 Low   PT General Charges $$ ACUTE PT VISIT: 1 Visit         Jordan [name], PT, DPT, GCS [phone]   Jordan [name] 04/11/2023, 4:02 PM

## 2023-04-12 ENCOUNTER — Encounter: Payer: Self-pay | Admitting: Cardiovascular Disease

## 2023-04-12 ENCOUNTER — Other Ambulatory Visit (HOSPITAL_COMMUNITY): Payer: Self-pay

## 2023-04-12 ENCOUNTER — Other Ambulatory Visit: Payer: Self-pay

## 2023-04-12 ENCOUNTER — Telehealth (HOSPITAL_COMMUNITY): Payer: Self-pay | Admitting: Pharmacy Technician

## 2023-04-12 DIAGNOSIS — I5023 Acute on chronic systolic (congestive) heart failure: Secondary | ICD-10-CM | POA: Diagnosis not present

## 2023-04-12 LAB — PHOSPHORUS: Phosphorus: 3.6 mg/dL (ref 2.5–4.6)

## 2023-04-12 LAB — MAGNESIUM: Magnesium: 2.4 mg/dL (ref 1.7–2.4)

## 2023-04-12 LAB — BASIC METABOLIC PANEL WITH GFR
Anion gap: 9 (ref 5–15)
BUN: 22 mg/dL (ref 8–23)
CO2: 26 mmol/L (ref 22–32)
Calcium: 9 mg/dL (ref 8.9–10.3)
Chloride: 104 mmol/L (ref 98–111)
Creatinine, Ser: 0.97 mg/dL (ref 0.61–1.24)
GFR, Estimated: >60 mL/min (ref >60)
Glucose, Bld: 101 mg/dL — ABNORMAL HIGH (ref 70–99)
Potassium: 4.5 mmol/L (ref 3.5–5.1)
Sodium: 139 mmol/L (ref 135–145)

## 2023-04-12 LAB — CBC
HCT: 39.6 % (ref 39.0–52.0)
Hemoglobin: 13.2 g/dL (ref 13.0–17.0)
MCH: 30 pg (ref 26.0–34.0)
MCHC: 33.3 g/dL (ref 30.0–36.0)
MCV: 90 fL (ref 80.0–100.0)
Platelets: 158 10*3/uL (ref 150–400)
RBC: 4.4 MIL/uL (ref 4.22–5.81)
RDW: 16.1 % — ABNORMAL HIGH (ref 11.5–15.5)
WBC: 9.7 10*3/uL (ref 4.0–10.5)
nRBC: 0 % (ref 0.0–0.2)

## 2023-04-12 MED ORDER — POTASSIUM CHLORIDE CRYS ER 20 MEQ PO TBCR
20.0000 meq | EXTENDED_RELEASE_TABLET | Freq: Every day | ORAL | 0 refills | Status: AC
  Filled 2023-04-12: qty 30, 30d supply, fill #0

## 2023-04-12 MED ORDER — FUROSEMIDE 40 MG PO TABS
40.0000 mg | ORAL_TABLET | Freq: Every day | ORAL | Status: DC
  Administered 2023-04-12: 40 mg via ORAL
  Filled 2023-04-12: qty 1

## 2023-04-12 MED ORDER — SPIRONOLACTONE 25 MG PO TABS: 25.0000 mg | ORAL_TABLET | Freq: Every day | ORAL | Status: DC

## 2023-04-12 MED ORDER — POTASSIUM CHLORIDE CRYS ER 20 MEQ PO TBCR: 20.0000 meq | EXTENDED_RELEASE_TABLET | Freq: Every day | ORAL | Status: DC

## 2023-04-12 MED ORDER — SPIRONOLACTONE 25 MG PO TABS
25.0000 mg | ORAL_TABLET | Freq: Every day | ORAL | 0 refills | Status: AC
  Filled 2023-04-12: qty 90, 90d supply, fill #0

## 2023-04-12 MED ORDER — POTASSIUM CHLORIDE CRYS ER 20 MEQ PO TBCR
20.0000 meq | EXTENDED_RELEASE_TABLET | Freq: Every day | ORAL | Status: DC
Start: 2023-04-12 — End: 2023-04-12

## 2023-04-12 MED ORDER — SACUBITRIL-VALSARTAN 49-51 MG PO TABS
1.0000 | ORAL_TABLET | Freq: Two times a day (BID) | ORAL | 0 refills | Status: AC
  Filled 2023-04-12: qty 60, 30d supply, fill #0

## 2023-04-12 MED ORDER — METOPROLOL SUCCINATE ER 100 MG PO TB24
100.0000 mg | ORAL_TABLET | Freq: Every day | ORAL | 0 refills | Status: AC
  Filled 2023-04-12: qty 90, 90d supply, fill #0

## 2023-04-12 MED ORDER — METOPROLOL SUCCINATE ER 100 MG PO TB24: 100.0000 mg | ORAL_TABLET | Freq: Every day | ORAL | Status: DC

## 2023-04-12 MED ORDER — FUROSEMIDE 40 MG PO TABS
40.0000 mg | ORAL_TABLET | Freq: Every day | ORAL | 1 refills | Status: AC
  Filled 2023-04-12: qty 30, 30d supply, fill #0

## 2023-04-12 MED ORDER — SACUBITRIL-VALSARTAN 49-51 MG PO TABS: 1.0000 | ORAL_TABLET | Freq: Two times a day (BID) | ORAL | Status: DC

## 2023-04-12 NOTE — Plan of Care (Signed)

## 2023-04-12 NOTE — Telephone Encounter (Signed)
 Patient Product/process development scientist completed.    The patient is insured through [employer]. Patient has Medicare and is not eligible for a copay card, but may be able to apply for patient assistance or Medicare RX Payment Plan (Patient Must reach out to their plan, if eligible for payment plan), if available.    Ran test claim for Entresto 24-26 mg and the current 30 day co-pay is $30.00.  Ran test claim for Farxga 10 mg and the current 30 day co-pay is $20.00.  Ran test claim for Jardiance 10 mg and the current 30 day co-pay is $20.00.  This test claim was processed through [facility]- copay amounts may vary at other pharmacies due to pharmacy/plan contracts, or as the patient moves through the different stages of their insurance plan.     Roland Earl, CPHT Pharmacy Technician III Certified Patient Advocate [facility] Patient Advocate Team Direct Number: [phone]  Fax: [phone]

## 2023-04-12 NOTE — Discharge Instructions (Signed)
 Follow up with PCP and pick up medications from pharmacy. Make sure to take all antibiotics. Do not double up on narcotic/pain medication or drink alcohol during this time. Do not drive while taking narcotic medication. Call 911 or return to ER for life threatening issues or other concerns.

## 2023-04-12 NOTE — Progress Notes (Signed)
  I have reviewed and concur with this student's documentation.   Allyn Kenner , BSN, RN

## 2023-04-12 NOTE — Discharge Summary (Signed)
 [facility] Discharge Summary   Patient: Jordan [name]:[id number]  PCP: Damaris Schooner, DO  Date of admission: 04/08/2023   Date of discharge:  04/12/2023     Discharge Diagnoses:  Principal Problem:   Acute on chronic systolic CHF (congestive heart failure) (HCC) Active Problems:   Myocardial injury   Atrial fibrillation, chronic (HCC)   Bronchiectasis without complication (HCC)   COPD (chronic obstructive pulmonary disease) (HCC)   Essential hypertension   HLD (hyperlipidemia)   Thrombocytopenia (HCC)   Multiple sclerosis (HCC)   Benign prostatic hyperplasia without lower urinary tract symptoms   Depression with anxiety   Overweight (BMI 25.0-29.9)   Persistent atrial fibrillation (HCC)   Atrial fibrillation with rapid ventricular response (HCC)   Flash pulmonary edema (HCC)   Acute congestive heart failure (HCC)   Acute respiratory failure (HCC)   Admitted From: Home Disposition:  Home   Recommendations for Outpatient Follow-up:  Follow-up with PCP in 1 week Follow-up with cardiology in 1 to 2 weeks Follow up LABS/TEST:     Follow-up Information     Damaris Schooner, DO Follow up in 1 week(s).   Specialty: Family Medicine Contact information: [address] [place] [address] [phone]         Antonieta Iba, MD Follow up in 1 week(s).   Specialty: Cardiology Contact information: [address] [place] [address] [phone]                Diet recommendation: Cardiac diet  Activity: The patient is advised to gradually reintroduce usual activities, as tolerated  Discharge Condition: stable  Code Status: DNR/DNI-limited  History of present illness: As per the H and P dictated on admission Hospital Course:  Jordan [name] is a 78 y.o. male with medical history significant of sCHF with EF30-35%,  A fib on Eliquis, HTN, HLD, COPD, depression with anxiety, BPH, Parkinson's disease, bronchiectasis,  kidney stone, multiple sclerosis, thrombocytopenia, who presents with SOB and leg edema.   Patient states that he has shortness breath in the past several days, which has been progressively worsening.  Patient does not have cough and chest pain.  No fever or chills.  No nausea, vomiting, diarrhea or abdominal pain.  No symptoms of UTI.  Patient has worsening bilateral leg edema recently.  Patient is taking Eliquis consistently.   Per ED physician, patient does not have oxygen desaturation, but BiPAP is started in ED for acute CHF/pulmonary edema.  Nitroglycerin drip is started in ED.   Data reviewed independently and ED Course: pt was found to have BNP 4363, GFR> 60, troponin 35, temperature normal, blood pressure 181/117, heart rate 114, 73, RR 38, 18.  Chest x-ray showed cardiomegaly, vascular congestion and small left pleural effusion.  Patient is admitted to PCU as inpatient.     EKG: I have personally reviewed.  A-fib, QTc 487, poor R wave progression, heart rate 108.     Assessment and Plan:  # Acute on chronic systolic CHF (congestive heart failure) [facility]): Patient has SOB, significantly elevated BNP 4363, bilateral 2+ leg edema, positive JVD, crackles on auscultation, vascular congestion on chest x-ray, clinically consistent with CHF exacerbation.  2D echo on 08/23/2022 showed EF 30-35%. -s/p Wean off BiPAP -s/p Wean off nitroglycerin drip S/p Lasix 40 mg IV bid, d/c'd on 4/8 -Continue home spironolactone 4/9 s/p losartan 50 mg x 1 dose, followed by 100 mg p.o. daily from 4/10  4/7 TTE LVEF 40 to  45%, global hypokinesis, grade 1 diastolic dysfunction.  Moderate PAH, severely dilated LA and RA, moderate MR and TR Patient was recommended to continue fluid restriction 1.5 L/day.  Patient was seen by cardiology, and cleared to discharge on following medications and follow-up as an outpatient.. Patient was discharged on Lasix 40 mg p.o. daily, Toprol-XL 100 mg p.o. daily, Entresto 49-51 mg  p.o. daily, Aldactone 25 mg p.o. daily.   # Atrial fibrillation, chronic, with RVR, on Eliquis Discharged on Toprol-XL 100 mg p.o. daily. Cardiology consulted, s/p status post DCCV cardioversion, patient is back to normal sinus rhythm.  Patient was cleared by cardiology to discharge and follow-up as an outpatient.  # Hypokalemia secondary to diuresis. Potassium repleted.  Resolved # Myocardial injury: Trop 35. No CP.  Likely demand ischemia. Continue Lipitor and Eliquis - TTE LVEF 40 to 45%, grade 1 diastolic dysfunction, LA and RA severely dilated, moderate MR, moderate TR. Cardiology following as above   # Bronchiectasis without complication and COPD (chronic obstructive pulmonary disease): - Bronchodilators and as needed Mucinex  # Essential hypertension: Discontinued amlodipine, losartan and metoprolol tartrate. Increased Aldactone 25 mg p.o. daily.  Gradually titrated up metoprolol, discharged on Toprol-XL 100 mg p.o. daily.  And started Entresto. Patient was advised to monitor BP at home and follow with PCP and cardiology as an outpatient. # HLD (hyperlipidemia): on Lipitor 80 mg p.o. daily # Thrombocytopenia: Chronic off-and-on, resolved.  Platelet 158 today # Multiple sclerosis: Stable.  Patient is not taking medications currently. Continue fall precaution # Benign prostatic hyperplasia without lower urinary tract symptoms.  Patient is not on any medication.  Recommend to follow with PCP and urology as an outpatient. # Depression with anxiety: Continue home Wellbutrin and Zoloft. # Parkinson's disease: Patient does not take Sinemet as per med rec  Body mass index is 23.68 kg/m.  Nutrition Interventions:  Pressure Injury 04/10/23 Buttocks Right Stage 2 -  Partial thickness loss of dermis presenting as a shallow open injury with a red, pink wound bed without slough. skin tear medial toward gluteal cleft (Active)  04/10/23 1630  Location: Buttocks  Location Orientation: Right   Staging: Stage 2 -  Partial thickness loss of dermis presenting as a shallow open injury with a red, pink wound bed without slough.  Wound Description (Comments): skin tear medial toward gluteal cleft  Present on Admission:   Dressing Type Foam - Lift dressing to assess site every shift 04/12/23 1012     Patient was ambulatory without any assistance. On the day of the discharge the patient's vitals were stable, and no other acute medical condition were reported by patient. the patient was felt safe to be discharge at Home.  Consultants: Cardiology Procedures: Cardioversion   Discharge Exam: General: Appear in no distress, no Rash; Oral Mucosa Clear, moist. Cardiovascular: S1 and S2 Present, no Murmur, Respiratory: normal respiratory effort, Bilateral Air entry present and no Crackles, no wheezes Abdomen: Bowel Sound present, Soft and no tenderness, no hernia Extremities: no Pedal edema, no calf tenderness Neurology: alert and oriented to time, place, and person affect appropriate.  Filed Weights   04/08/23 2132 04/11/23 0500 04/12/23 0500  Weight: 88 kg 80 kg 79.2 kg   Vitals:   04/12/23 0739 04/12/23 1121  BP: (!) 155/87 (!) 140/63  Pulse: 70 69  Resp:    Temp: 97.9 F (36.6 C) 97.9 F (36.6 C)  SpO2: 100% 94%    DISCHARGE MEDICATION: Allergies as of 04/12/2023  Reactions   Latex Anaphylaxis   Penicillins Hives, Rash   Primidone Shortness Of Breath   Doxycycline Other (See Comments)   Abdominal cramping        Medication List     STOP taking these medications    amLODipine 10 MG tablet Commonly known as: NORVASC   losartan 100 MG tablet Commonly known as: COZAAR   metoprolol tartrate 50 MG tablet Commonly known as: LOPRESSOR       TAKE these medications    albuterol (2.5 MG/3ML) 0.083% nebulizer solution Commonly known as: PROVENTIL Take 2.5 mg by nebulization every 6 (six) hours as needed for wheezing or shortness of breath.   apixaban 5  MG Tabs tablet Commonly known as: ELIQUIS Take 5 mg by mouth in the morning.   atorvastatin 80 MG tablet Commonly known as: LIPITOR Take 80 mg by mouth daily.   buPROPion ER 100 MG 12 hr tablet Commonly known as: WELLBUTRIN SR Take 100 mg by mouth daily. At night   esomeprazole 40 MG capsule Commonly known as: NEXIUM Take 1 capsule (40 mg total) by mouth daily. What changed: when to take this   fluticasone 50 MCG/ACT nasal spray Commonly known as: FLONASE Place 1 spray into both nostrils daily as needed for allergies.   furosemide 40 MG tablet Commonly known as: LASIX Take 1 tablet (40 mg total) by mouth daily.   gabapentin 600 MG tablet Commonly known as: NEURONTIN Take 1,800 mg by mouth at bedtime. What changed: how much to take   latanoprost 0.005 % ophthalmic solution Commonly known as: XALATAN Place 1 drop into both eyes at bedtime.   metoprolol succinate 100 MG 24 hr tablet Commonly known as: TOPROL-XL Take 1 tablet (100 mg total) by mouth daily. Take with or immediately following a meal. Start taking on: April 13, 2023   potassium chloride SA 20 MEQ tablet Commonly known as: KLOR-CON M Take 1 tablet (20 mEq total) by mouth daily. Start taking on: April 13, 2023 What changed:  how much to take when to take this   sacubitril-valsartan 49-51 MG Commonly known as: ENTRESTO Take 1 tablet by mouth 2 (two) times daily. Start taking on: April 13, 2023   sertraline 100 MG tablet Commonly known as: ZOLOFT Take 100 mg by mouth daily. What changed: how much to take   spironolactone 25 MG tablet Commonly known as: ALDACTONE Take 1 tablet (25 mg total) by mouth daily. Start taking on: April 13, 2023 What changed: how much to take   Trelegy Ellipta 200-62.5-25 MCG/ACT Aepb Generic drug: Fluticasone-Umeclidin-Vilant Inhale 1 puff into the lungs daily.               Discharge Care Instructions  (From admission, onward)           Start      Ordered   04/12/23 0000  Discharge wound care:       Comments: As above   04/12/23 1307           Allergies  Allergen Reactions   Latex Anaphylaxis   Penicillins Hives and Rash   Primidone Shortness Of Breath   Doxycycline Other (See Comments)    Abdominal cramping    Discharge Instructions     Call MD for:   Complete by: As directed    Chest pain or palpitations   Call MD for:  difficulty breathing, headache or visual disturbances   Complete by: As directed    Call MD for:  extreme fatigue  Complete by: As directed    Call MD for:  persistant dizziness or light-headedness   Complete by: As directed    Call MD for:  persistant nausea and vomiting   Complete by: As directed    Call MD for:  severe uncontrolled pain   Complete by: As directed    Call MD for:  temperature >100.4   Complete by: As directed    Diet - low sodium heart healthy   Complete by: As directed    Discharge instructions   Complete by: As directed    Follow-up with PCP in 1 week Follow-up with cardiology in 1 to 2 weeks.   Discharge wound care:   Complete by: As directed    As above   Increase activity slowly   Complete by: As directed        The results of significant diagnostics from this hospitalization (including imaging, microbiology, ancillary and laboratory) are listed below for reference.    Significant Diagnostic Studies: ECHOCARDIOGRAM COMPLETE Result Date: 04/10/2023    ECHOCARDIOGRAM REPORT   Patient Name:   Jordan [name] Date of Exam: 04/09/2023 Medical Rec #:  [id number]    Height:       72.0 in Accession #:    [id number]   Weight:       194.0 lb Date of Birth:  02/07/1945    BSA:          2.103 m Patient Age:    77 years     BP:           142/82 mmHg Patient Gender: M            HR:           83 bpm. Exam Location:  [facility] Procedure: 2D Echo, Cardiac Doppler and Color Doppler (Both Spectral and Color            Flow Doppler were utilized during procedure). Indications:     CHF   History:         Patient has no prior history of Echocardiogram examinations.                  CHF, COPD, Arrythmias:Atrial Fibrillation, Signs/Symptoms:Chest                  Pain; Risk Factors:Hypertension and Dyslipidemia.  Sonographer:     Mikki Harbor Referring Phys:  [id number] Brien Few NIU Diagnosing Phys: Rozell Searing Custovic IMPRESSIONS  1. Left ventricular ejection fraction, by estimation, is 40 to 45%. The left ventricle has mildly decreased function. The left ventricle demonstrates global hypokinesis. Left ventricular diastolic parameters are consistent with Grade I diastolic dysfunction (impaired relaxation).  2. Right ventricular systolic function is normal. The right ventricular size is normal. There is moderately elevated pulmonary artery systolic pressure.  3. Left atrial size was severely dilated.  4. Right atrial size was severely dilated.  5. The mitral valve is normal in structure. Moderate mitral valve regurgitation. No evidence of mitral stenosis.  6. Tricuspid valve regurgitation is moderate.  7. The aortic valve is normal in structure. Aortic valve regurgitation is mild. No aortic stenosis is present.  8. The inferior vena cava is normal in size with greater than 50% respiratory variability, suggesting right atrial pressure of 3 mmHg. FINDINGS  Left Ventricle: Left ventricular ejection fraction, by estimation, is 40 to 45%. The left ventricle has mildly decreased function. The left ventricle demonstrates global hypokinesis. The left ventricular internal cavity size was normal in size.  There is  no left ventricular hypertrophy. Left ventricular diastolic parameters are consistent with Grade I diastolic dysfunction (impaired relaxation). Right Ventricle: The right ventricular size is normal. No increase in right ventricular wall thickness. Right ventricular systolic function is normal. There is moderately elevated pulmonary artery systolic pressure. The tricuspid regurgitant velocity is 3.09 m/s, and  with an assumed right atrial pressure of 8 mmHg, the estimated right ventricular systolic pressure is 46.2 mmHg. Left Atrium: Left atrial size was severely dilated. Right Atrium: Right atrial size was severely dilated. Pericardium: There is no evidence of pericardial effusion. Mitral Valve: The mitral valve is normal in structure. Moderate mitral valve regurgitation. No evidence of mitral valve stenosis. MV peak gradient, 3.5 mmHg. The mean mitral valve gradient is 1.0 mmHg. Tricuspid Valve: The tricuspid valve is normal in structure. Tricuspid valve regurgitation is moderate. Aortic Valve: The aortic valve is normal in structure. Aortic valve regurgitation is mild. Aortic regurgitation PHT measures 538 msec. No aortic stenosis is present. Aortic valve mean gradient measures 2.7 mmHg. Aortic valve peak gradient measures 5.1 mmHg. Aortic valve area, by VTI measures 3.03 cm. Pulmonic Valve: The pulmonic valve was normal in structure. Pulmonic valve regurgitation is not visualized. Aorta: The aortic root is normal in size and structure. Venous: The inferior vena cava is normal in size with greater than 50% respiratory variability, suggesting right atrial pressure of 3 mmHg. IAS/Shunts: No atrial level shunt detected by color flow Doppler.  LEFT VENTRICLE PLAX 2D LVIDd:         5.10 cm LVIDs:         3.60 cm LV PW:         1.70 cm LV IVS:        1.30 cm LVOT diam:     2.20 cm LV SV:         54 LV SV Index:   26 LVOT Area:     3.80 cm  RIGHT VENTRICLE RV Basal diam:  4.30 cm RV Mid diam:    3.70 cm RV S prime:     12.20 cm/s LEFT ATRIUM              Index        RIGHT ATRIUM           Index LA diam:        6.00 cm  2.85 cm/m   RA Area:     30.40 cm LA Vol (A2C):   128.0 ml 60.86 ml/m  RA Volume:   116.00 ml 55.16 ml/m LA Vol (A4C):   118.0 ml 56.11 ml/m LA Biplane Vol: 129.0 ml 61.34 ml/m  AORTIC VALVE                    PULMONIC VALVE AV Area (Vmax):    2.43 cm     PV Vmax:       1.16 m/s AV Area (Vmean):    2.65 cm     PV Peak grad:  5.4 mmHg AV Area (VTI):     3.03 cm AV Vmax:           113.43 cm/s AV Vmean:          66.633 cm/s AV VTI:            0.178 m AV Peak Grad:      5.1 mmHg AV Mean Grad:      2.7 mmHg LVOT Vmax:         72.50 cm/s  LVOT Vmean:        46.467 cm/s LVOT VTI:          0.142 m LVOT/AV VTI ratio: 0.80 AI PHT:            538 msec  AORTA Ao Root diam: 3.70 cm Ao Asc diam:  3.10 cm MITRAL VALVE               TRICUSPID VALVE MV Area (PHT): 3.87 cm    TR Peak grad:   38.2 mmHg MV Area VTI:   2.64 cm    TR Vmax:        309.00 cm/s MV Peak grad:  3.5 mmHg MV Mean grad:  1.0 mmHg    SHUNTS MV Vmax:       0.93 m/s    Systemic VTI:  0.14 m MV Vmean:      46.4 cm/s   Systemic Diam: 2.20 cm MV Decel Time: 196 msec MV E velocity: 76.80 cm/s Rozell Searing Custovic Electronically signed by Clotilde Dieter Signature Date/Time: 04/10/2023/1:28:29 PM    Final    DG Chest Portable 1 View Result Date: 04/08/2023 CLINICAL DATA:  Shortness of breath EXAM: PORTABLE CHEST 1 VIEW COMPARISON:  07/06/2022 FINDINGS: Possible small left effusion. Cardiomegaly with mild central congestion. Bronchitic changes. No pneumothorax IMPRESSION: Cardiomegaly with mild central congestion and possible small left effusion. Electronically Signed   By: Jasmine Pang M.D.   On: 04/08/2023 22:01    Microbiology: No results found for this or any previous visit (from the past 240 hours).   Labs: CBC: Recent Labs  Lab 04/08/23 2124 04/09/23 0543 04/10/23 0505 04/11/23 0416 04/12/23 0316  WBC 9.0 5.5 9.7 11.1* 9.7  NEUTROABS 6.3  --   --   --   --   HGB 13.1 11.2* 11.5* 14.1 13.2  HCT 41.2 33.7* 34.0* 42.5 39.6  MCV 94.7 92.6 89.9 90.6 90.0  PLT 123* 112* 137* 159 158   Basic Metabolic Panel: Recent Labs  Lab 04/09/23 0543 04/09/23 2139 04/10/23 0505 04/11/23 0416 04/11/23 1553 04/12/23 0316  NA 140  --  140 140 138 139  K 3.1* 3.2* 3.1* 3.0* 4.4 4.5  CL 106  --  104 101 103 104  CO2 25  --  26 28 25 26   GLUCOSE 143*   --  90 92 92 101*  BUN 16  --  16 16 18 22   CREATININE 0.89  --  0.80 0.87 1.00 0.97  CALCIUM 8.1*  --  8.3* 8.7* 8.6* 9.0  MG 2.0 1.9 2.4 2.3  --  2.4  PHOS 3.3  --  2.3* 3.9  --  3.6   Liver Function Tests: No results for input(s): "AST", "ALT", "ALKPHOS", "BILITOT", "PROT", "ALBUMIN" in the last 168 hours. No results for input(s): "LIPASE", "AMYLASE" in the last 168 hours. No results for input(s): "AMMONIA" in the last 168 hours. Cardiac Enzymes: No results for input(s): "CKTOTAL", "CKMB", "CKMBINDEX", "TROPONINI" in the last 168 hours. BNP (last 3 results) Recent Labs    04/08/23 2124  BNP 4,363.7*   CBG: No results for input(s): "GLUCAP" in the last 168 hours.  Time spent: 35 minutes  Signed:  Gillis Santa  [facility] 04/12/2023 1:07 PM

## 2023-04-12 NOTE — Progress Notes (Signed)
 Heart Failure Stewardship Pharmacy Note  PCP: Damaris Schooner, DO PCP-Cardiologist: None  HPI: Jordan [name] is a 78 y.o. male with sCHF,  A fib on Eliquis, HTN, HLD, COPD, depression with anxiety, BPH, Parkinson's disease, bronchiectasis, kidney stone, multiple sclerosis, thrombocytopenia who presented with shortness of breath, orthopnea, and lower extremity edema. Cardiac CTA 10/2022 with nonobstructive disease. On admission, BNP was 4363.7, HS-troponin was 32, and TSH was 3.899. Chest x-ray noted cardiomegaly with mild central congestion and possible small left effusion. Presented severely hypertensive in AF with RVR. Echocardiogram this admission showed LVEF of 40-45%, grade I diastolic dysfunction, severely dilated atria, moderate MR, moderate TR. Underwent DCCV 04/11/23.   Pertinent Lab Values: Creatinine, Ser  Date Value Ref Range Status  04/12/2023 0.97 0.61 - 1.24 mg/dL Final   BUN  Date Value Ref Range Status  04/12/2023 22 8 - 23 mg/dL Final   Potassium  Date Value Ref Range Status  04/12/2023 4.5 3.5 - 5.1 mmol/L Final   Sodium  Date Value Ref Range Status  04/12/2023 139 135 - 145 mmol/L Final   B Natriuretic Peptide  Date Value Ref Range Status  04/08/2023 4,363.7 (H) 0.0 - 100.0 pg/mL Final    Comment:    Performed at [facility], [address] [place], [place] [address]   Magnesium  Date Value Ref Range Status  04/12/2023 2.4 1.7 - 2.4 mg/dL Final    Comment:    Performed at [facility] [place], [address] [place], [place] [address]   TSH  Date Value Ref Range Status  04/10/2023 3.899 0.350 - 4.500 uIU/mL Final    Comment:    Performed by a 3rd Generation assay with a functional sensitivity of <=0.01 uIU/mL. Performed at [facility], [address] [place], [place] [address]     Vital Signs: Admission weight: 194 lbs Temp:  [97.7 F (36.5 C)-98.2 F (36.8 C)] 97.9 F (36.6 C) (04/10 0739) Pulse Rate:  [62-95] 70 (04/10  0739) Cardiac Rhythm: Normal sinus rhythm (04/10 0700) Resp:  [18-20] 20 (04/10 0351) BP: (125-185)/(63-100) 155/87 (04/10 0739) SpO2:  [93 %-100 %] 100 % (04/10 0739) Weight:  [79.2 kg (174 lb 9.7 oz)] 79.2 kg (174 lb 9.7 oz) (04/10 0500)  Intake/Output Summary (Last 24 hours) at 04/12/2023 1037 Last data filed at 04/12/2023 1000 Gross per 24 hour  Intake 1025.22 ml  Output 251 ml  Net 774.22 ml   Current Heart Failure Medications:  Loop diuretic: none Beta-Blocker: metoprolol tartrate 50 mg BID ACEI/ARB/ARNI: losartan 100 mg daily MRA: spironolactone 12.5 mg daily SGLT2i: none Other: none  Prior to admission Heart Failure Medications:  Loop diuretic: none Beta-Blocker: metoprolol tartrate 50 mg BID ACEI/ARB/ARNI: losartan 100 mg daily MRA: none SGLT2i: none Other: none  Assessment: 1. Acute combined systolic and diastolic heart failure (LVEF 40-45%) with grade I diastolic dysfunction, due to presumed NICM. NYHA class II-III symptoms.  -Symptoms: Reports feeling much improved with diuresis -Volume: Received IV Lasix x 3 days. Weight down 20 lbs from admission -Hemodynamics: BP is persistently elevated. HR 60-70s this AM.  -BB: Metoprolol tartrate consolidated to metoprolol succinate.  -ACEI/ARB/ARNI: Consider transitioning losartan to Entresto 49-51 mg BID 25 hours from last dose. EF is still within the range for males where there would be benefit to [facility]. Entresto would also help with BP control. -MRA: Continue spironolactone 12.5 mg daily today. Can consider increasing tomorrow. -SGLT2i: Given ureteral stenting, may be at higher risk for UTI. Would not initiate  without discussing with urology. Could consider initiating after risk/benefit discussion outpatient.  Plan: 1) Medication changes recommended at this time: -Consider transitioning losartan to Entresto 49/51 mg BID  2) Patient assistance: Sherryll Burger copay is $35.00, Marcelline Deist copay is $20.00, Jardiance copay is  $20.00   3) Education: - Patient has been educated on current HF medications and potential additions to HF medication regimen - Patient verbalizes understanding that over the next few months, these medication doses may change and more medications may be added to optimize HF regimen - Patient has been educated on basic disease state pathophysiology and goals of therapy  Medication Assistance / Insurance Benefits Check: Does the patient have prescription insurance?    Type of insurance plan:  Does the patient qualify for medication assistance through manufacturers or grants? Pending  Outpatient Pharmacy: Prior to admission outpatient pharmacy: [facility]   Is the patient willing to utilize a [facility] pharmacy at discharge?: Yes  Please do not hesitate to reach out with questions or concerns,  Enos Fling, PharmD, CPP, BCPS Heart Failure Pharmacist  Phone - [phone] 04/12/2023 10:38 AM

## 2023-04-12 NOTE — Progress Notes (Signed)
 AVS Discharge instructions provided to pt and pt's wife, at bedside. All questions answered at this time. Teach-back technique utilized. All PIVs removed with sites WDL. All pt belongings taken with pt--pt verified. Pt transported home with wife via personal vehicle.

## 2023-04-12 NOTE — Progress Notes (Signed)
 Pt awoke from sleep in a coughing spell and had trouble catching his breath. Auscultated lung sounds and the remained clear with minor wheezing noted. Pt req PRN breathing treatment. Respiratory notified.  04/12/23 0626  Vitals  BP (!) 185/100  MAP (mmHg) 124  BP Method Automatic  Pulse Rate 85  Pulse Rate Source Monitor  MEWS COLOR  MEWS Score Color Green  Oxygen Therapy  SpO2 95 %  MEWS Score  MEWS Temp 0  MEWS Systolic 0  MEWS Pulse 0  MEWS RR 0  MEWS LOC 0  MEWS Score 0   Ether Griffins, RN

## 2023-04-12 NOTE — Progress Notes (Signed)
 Progress Note  Patient Name: Jordan [name] Date of Encounter: 04/12/2023  Primary Cardiologist: New - consult by End  Subjective   Coughing episode this morning with some SOB, currently improved. No chest pain or palpitations. Remains in sinus rhythm following DCCV on 4/9. BP running elevated. Labs stable.  Inpatient Medications    Scheduled Meds:  apixaban  5 mg Oral BID   atorvastatin  80 mg Oral Daily   buPROPion ER  100 mg Oral q morning   fluticasone furoate-vilanterol  1 puff Inhalation Daily   And   umeclidinium bromide  1 puff Inhalation Daily   furosemide  40 mg Oral Daily   gabapentin  1,200 mg Oral QHS   [START ON 04/13/2023] metoprolol succinate  100 mg Oral Daily   metoprolol tartrate  50 mg Oral BID   multivitamin with minerals  1 tablet Oral Daily   pantoprazole  40 mg Oral Daily   potassium chloride  20 mEq Oral Daily   [START ON 04/13/2023] sacubitril-valsartan  1 tablet Oral BID   sertraline  150 mg Oral Daily   spironolactone  12.5 mg Oral Daily   Continuous Infusions:  PRN Meds: acetaminophen, albuterol, dextromethorphan-guaiFENesin, hydrALAZINE, metoprolol tartrate, ondansetron (ZOFRAN) IV   Vital Signs    Vitals:   04/12/23 0351 04/12/23 0500 04/12/23 0626 04/12/23 0739  BP: (!) 149/76  (!) 185/100 (!) 155/87  Pulse: 62  85 70  Resp: 20     Temp: 98.2 F (36.8 C)   97.9 F (36.6 C)  TempSrc:    Oral  SpO2: 93%  95% 100%  Weight:  79.2 kg    Height:        Intake/Output Summary (Last 24 hours) at 04/12/2023 1031 Last data filed at 04/12/2023 1000 Gross per 24 hour  Intake 1025.22 ml  Output 251 ml  Net 774.22 ml   Filed Weights   04/08/23 2132 04/11/23 0500 04/12/23 0500  Weight: 88 kg 80 kg 79.2 kg    Telemetry    SR - Personally Reviewed  ECG    No new tracings - Personally Reviewed  Physical Exam   GEN: No acute distress.   Neck: No JVD. Cardiac: RRR, no murmurs, rubs, or gallops.  Respiratory: Clear to auscultation  bilaterally.  GI: Soft, nontender, non-distended.   MS: No edema; No deformity. Neuro:  Alert and oriented x 3; Nonfocal.  Psych: Normal affect.  Labs    Chemistry Recent Labs  Lab 04/11/23 0416 04/11/23 1553 04/12/23 0316  NA 140 138 139  K 3.0* 4.4 4.5  CL 101 103 104  CO2 28 25 26   GLUCOSE 92 92 101*  BUN 16 18 22   CREATININE 0.87 1.00 0.97  CALCIUM 8.7* 8.6* 9.0  GFRNONAA >60 >60 >60  ANIONGAP 11 10 9      Hematology Recent Labs  Lab 04/10/23 0505 04/11/23 0416 04/12/23 0316  WBC 9.7 11.1* 9.7  RBC 3.78* 4.69 4.40  HGB 11.5* 14.1 13.2  HCT 34.0* 42.5 39.6  MCV 89.9 90.6 90.0  MCH 30.4 30.1 30.0  MCHC 33.8 33.2 33.3  RDW 16.0* 15.9* 16.1*  PLT 137* 159 158    Cardiac EnzymesNo results for input(s): "TROPONINI" in the last 168 hours. No results for input(s): "TROPIPOC" in the last 168 hours.   BNP Recent Labs  Lab 04/08/23 2124  BNP 4,363.7*     DDimer No results for input(s): "DDIMER" in the last 168 hours.   Radiology    No  results found.  Cardiac Studies   2D echo 04/09/2023: 1. Left ventricular ejection fraction, by estimation, is 40 to 45%. The  left ventricle has mildly decreased function. The left ventricle  demonstrates global hypokinesis. Left ventricular diastolic parameters are  consistent with Grade I diastolic  dysfunction (impaired relaxation).   2. Right ventricular systolic function is normal. The right ventricular  size is normal. There is moderately elevated pulmonary artery systolic  pressure.   3. Left atrial size was severely dilated.   4. Right atrial size was severely dilated.   5. The mitral valve is normal in structure. Moderate mitral valve  regurgitation. No evidence of mitral stenosis.   6. Tricuspid valve regurgitation is moderate.   7. The aortic valve is normal in structure. Aortic valve regurgitation is  mild. No aortic stenosis is present.   8. The inferior vena cava is normal in size with greater than 50%   respiratory variability, suggesting right atrial pressure of 3 mmHg.  __________  Coronary CTA 10/20/2022 [facility]): Coronary Angiography:  Coronary Dominance: RCA   Left Main:  - Ostial Left Main: no significant plaque or stenosis  - Left Main: 10% stenosis (calcified plaque)   Left Anterior Descending:  - Ostial LAD: 20 to 30% stenosis (calcified plaque, serial lesions)  - Proximal LAD: 20 to 30% stenosis (calcified plaque, serial lesions).  - Mid LAD: 20 to 30% stenosis (calcified plaque, serial lesions)  - Distal LAD: no significant plaque or stenosis  - First diagonal: 30 to 40% stenosis (calcified plaque, serial lesions)   Left Circumflex:  - Ostial LCx: 10% stenosis (calcified plaque)  - Proximal LCx: 10% stenosis (calcified plaque)  - Mid LCx: 10% stenosis (calcified plaque)  - Distal LCx: is not well visualized  - OM1: no significant plaque or stenosis  - OM2: small caliber with no significant plaque or stenosis   Ramus:  - Ramus: 50% stenosis (calcified plaque, bulky plaque, serial lesions)   Right coronary artery:  - Ostial RCA: calcified plaque, without significant luminal stenosis  - Proximal RCA: 20 to 30% stenosis (calcified plaque, serial lesions)  - Mid RCA: 20 to 30% stenosis (calcified plaque, serial lesions)  - Distal RCA: 20 to 30% stenosis (calcified plaque, serial lesions)  - PDA: 30 to 40% stenosis (calcified plaque)  - RCA continuation: no significant plaque or stenosis  - PL1: no significant plaque or stenosis    CARDIAC CHAMBERS:  The left ventricle, left atrium, right ventricle, and right atrium all  appear qualitatively normal in size.   CARDIAC VALVES:  There is mild aortic valve calcification. There is no thickening or  calcification of the mitral valve.   PERICARDIUM:  The pericardial contour is preserved with no effusion, thickening or  calcifications.   PULMONARY VEINS:  2 left and 2 right sided pulmonary veins return normally to the  left  atrium.   GREAT VESSELS:  The aorta is normal in caliber in the visualized segments. The main  pulmonary artery is mildly dilated (~3.2 cm)    Patient Profile     78 y.o. male with history of CAD, HFrEF, persistent A-fib on Eliquis, hypertension, hyperlipidemia, COPD, depression with anxiety, BPH, Parkinson's disease, bronchiectasis, and MS who is being seen 04/12/2023 for the evaluation of A-fib and acute on chronic HFrEF.   Assessment & Plan    1. 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 Persistent Afib: - Maintaining sinus rhythm s/p DCCV on 4/9 - Recommend outpatient EP evaluation for recurrent arrhythmia given cardiomyopathy and dilated left atrium - Transition from Lopressor to Toprol-XL as outlined above - CHA2DS2-VASc at least - Apixaban 5 mg twice daily, does not meet reduced dosing criteria  3. CAD involving the native coronary arteries with elevated troponin: - Initial and peak of 35, down trending to 32 - Coronary CTA at [facility] in 10/2022 without obstructive disease - Not consistent with ACS - No current plans for inpatient ischemic evaluation  4. HTN: - Blood pressure mildly elevated this morning - Titration of pharmacotherapy as outlined above  5. Hypokalemia: - Repleted  - Spironolactone as above with MD recommendation to continue KCl 20 mEq daily starting on 4/11 - Magnesium at goal      For questions or updates, please contact [facility] Please  consult [email or web address] for contact info under Cardiology/STEMI.    Signed, Eula Listen, PA-C [facility] Pager: [phone] 04/12/2023, 10:31 AM

## 2023-04-26 ENCOUNTER — Other Ambulatory Visit: Payer: Self-pay

## 2023-06-06 ENCOUNTER — Ambulatory Visit: Admitting: Student
